# Patient Record
Sex: Female | Born: 1965 | Race: Black or African American | Hispanic: No | Marital: Married | Smoking: Never smoker
Health system: Southern US, Community
[De-identification: ages and names within clinical notes are randomized; demographics above are authoritative.]

## PROBLEM LIST (undated history)

## (undated) DIAGNOSIS — I10 Essential (primary) hypertension: Secondary | ICD-10-CM

## (undated) DIAGNOSIS — K219 Gastro-esophageal reflux disease without esophagitis: Secondary | ICD-10-CM

## (undated) HISTORY — PX: OTHER SURGICAL HISTORY: SHX169

## (undated) HISTORY — PX: APPENDECTOMY: SHX54

---

## 1989-08-02 DIAGNOSIS — O321XX0 Maternal care for breech presentation, not applicable or unspecified: Secondary | ICD-10-CM

## 2010-06-24 ENCOUNTER — Emergency Department: Payer: Self-pay | Admitting: Emergency Medicine

## 2012-07-04 ENCOUNTER — Emergency Department: Payer: Self-pay | Admitting: Emergency Medicine

## 2012-07-04 LAB — URINALYSIS, COMPLETE
Bilirubin,UR: NEGATIVE
Ketone: NEGATIVE
Leukocyte Esterase: NEGATIVE
Protein: NEGATIVE
Specific Gravity: 1.016 (ref 1.003–1.030)
Squamous Epithelial: 6
WBC UR: 1 /HPF (ref 0–5)

## 2012-07-04 LAB — RAPID INFLUENZA A&B ANTIGENS

## 2012-07-04 LAB — COMPREHENSIVE METABOLIC PANEL
Alkaline Phosphatase: 84 U/L (ref 50–136)
Anion Gap: 5 — ABNORMAL LOW (ref 7–16)
Bilirubin,Total: 0.6 mg/dL (ref 0.2–1.0)
Chloride: 110 mmol/L — ABNORMAL HIGH (ref 98–107)
Co2: 25 mmol/L (ref 21–32)
Creatinine: 0.88 mg/dL (ref 0.60–1.30)
EGFR (African American): >60
EGFR (Non-African Amer.): >60
Osmolality: 278 (ref 275–301)
Potassium: 3.8 mmol/L (ref 3.5–5.1)
SGOT(AST): 37 U/L (ref 15–37)
Sodium: 140 mmol/L (ref 136–145)

## 2012-07-04 LAB — CBC
HCT: 38.2 % (ref 35.0–47.0)
Platelet: 330 10*3/uL (ref 150–440)
RBC: 4.21 10*6/uL (ref 3.80–5.20)
RDW: 12.4 % (ref 11.5–14.5)
WBC: 12.2 10*3/uL — ABNORMAL HIGH (ref 3.6–11.0)

## 2015-05-06 ENCOUNTER — Encounter: Payer: Self-pay | Admitting: Obstetrics and Gynecology

## 2015-08-25 ENCOUNTER — Other Ambulatory Visit: Payer: Self-pay | Admitting: Family Medicine

## 2015-08-25 DIAGNOSIS — Z1231 Encounter for screening mammogram for malignant neoplasm of breast: Secondary | ICD-10-CM

## 2015-08-28 ENCOUNTER — Ambulatory Visit
Admission: RE | Admit: 2015-08-28 | Discharge: 2015-08-28 | Disposition: A | Discharge status: Home / Self Care | Payer: BLUE CROSS/BLUE SHIELD | Source: Ambulatory Visit | Attending: Family Medicine | Admitting: Family Medicine

## 2015-08-28 DIAGNOSIS — Z1231 Encounter for screening mammogram for malignant neoplasm of breast: Secondary | ICD-10-CM | POA: Insufficient documentation

## 2015-11-19 ENCOUNTER — Emergency Department
Admission: EM | Admit: 2015-11-19 | Discharge: 2015-11-19 | Disposition: A | Discharge status: Home / Self Care | Payer: BLUE CROSS/BLUE SHIELD | Attending: Emergency Medicine | Admitting: Emergency Medicine

## 2015-11-19 ENCOUNTER — Encounter: Payer: Self-pay | Admitting: Emergency Medicine

## 2015-11-19 ENCOUNTER — Emergency Department: Payer: BLUE CROSS/BLUE SHIELD

## 2015-11-19 DIAGNOSIS — I10 Essential (primary) hypertension: Secondary | ICD-10-CM | POA: Diagnosis not present

## 2015-11-19 DIAGNOSIS — R079 Chest pain, unspecified: Secondary | ICD-10-CM | POA: Insufficient documentation

## 2015-11-19 DIAGNOSIS — R002 Palpitations: Secondary | ICD-10-CM | POA: Diagnosis not present

## 2015-11-19 HISTORY — DX: Essential (primary) hypertension: I10

## 2015-11-19 LAB — CBC
HEMATOCRIT: 36.9 % (ref 35.0–47.0)
HEMOGLOBIN: 12.7 g/dL (ref 12.0–16.0)
MCH: 30.3 pg (ref 26.0–34.0)
MCHC: 34.4 g/dL (ref 32.0–36.0)
MCV: 88.2 fL (ref 80.0–100.0)
Platelets: 310 10*3/uL (ref 150–440)
RBC: 4.18 MIL/uL (ref 3.80–5.20)
RDW: 13.1 % (ref 11.5–14.5)
WBC: 9.1 10*3/uL (ref 3.6–11.0)

## 2015-11-19 LAB — COMPREHENSIVE METABOLIC PANEL
ALK PHOS: 43 U/L (ref 38–126)
ALT: 16 U/L (ref 14–54)
ANION GAP: 8 (ref 5–15)
AST: 23 U/L (ref 15–41)
Albumin: 4.3 g/dL (ref 3.5–5.0)
BILIRUBIN TOTAL: 1.2 mg/dL (ref 0.3–1.2)
BUN: 9 mg/dL (ref 6–20)
CALCIUM: 9.5 mg/dL (ref 8.9–10.3)
CO2: 23 mmol/L (ref 22–32)
Chloride: 106 mmol/L (ref 101–111)
Creatinine, Ser: 1.05 mg/dL — ABNORMAL HIGH (ref 0.44–1.00)
Glucose, Bld: 101 mg/dL — ABNORMAL HIGH (ref 65–99)
Potassium: 3.5 mmol/L (ref 3.5–5.1)
SODIUM: 137 mmol/L (ref 135–145)
TOTAL PROTEIN: 7.9 g/dL (ref 6.5–8.1)

## 2015-11-19 LAB — FIBRIN DERIVATIVES D-DIMER (ARMC ONLY): FIBRIN DERIVATIVES D-DIMER (ARMC): 370 (ref 0–499)

## 2015-11-19 LAB — TROPONIN I: Troponin I: <0.03 ng/mL (ref <0.031)

## 2015-11-19 LAB — LIPASE, BLOOD: LIPASE: 14 U/L (ref 11–51)

## 2015-11-19 LAB — POCT PREGNANCY, URINE: Preg Test, Ur: NEGATIVE

## 2015-11-19 MED ORDER — ASPIRIN 81 MG PO CHEW
324.0000 mg | CHEWABLE_TABLET | Freq: Once | ORAL | Status: AC
  Administered 2015-11-19: 324 mg via ORAL
  Filled 2015-11-19: qty 4

## 2015-11-19 MED ORDER — SODIUM CHLORIDE 0.9 % IV BOLUS (SEPSIS)
1000.0000 mL | Freq: Once | INTRAVENOUS | Status: AC
  Administered 2015-11-19: 1000 mL via INTRAVENOUS

## 2015-11-19 NOTE — ED Provider Notes (Signed)
[facility] Emergency Department Provider Note  ____________________________________________  Time seen: Approximately 12:46 PM  I have reviewed the triage vital signs and the nursing notes.   HISTORY  Chief Complaint Palpitations    HPI Lauren Collier is a 50 y.o. female with history of hypertension who presents for evaluation of 2 days constant chest heaviness, gradual onset, constant since onset, no modifying factors, currently mild to moderate and associated with intermittent palpitations. She is also had mild shortness of breath. She reports that she began taking phentermine for weight loss 3 days ago, this was prescribed to her at a wellness clinic. No nausea, vomiting, diarrhea, fevers or chills per she has had cough. She has no history of coronary artery disease. She does have family history of coronary artery disease and one of her sisters died at the age of 27 from an MI. She denies any history of DVT or PE.   Past Medical History  Diagnosis Date  . Hypertension     There are no active problems to display for this patient.   Past Surgical History  Procedure Laterality Date  . Cesarean section    . Appendectomy      No current outpatient prescriptions on file.  Allergies Review of patient's allergies indicates no known allergies.  Family History  Problem Relation Age of Onset  . Breast cancer Mother 36    Social History Social History  Substance Use Topics  . Smoking status: Never Smoker   . Smokeless tobacco: None  . Alcohol Use: Yes    Review of Systems Constitutional: No fever/chills Eyes: No visual changes. ENT: No sore throat. Cardiovascular: +chest pain. Respiratory: + shortness of breath. Gastrointestinal: No abdominal pain.  No nausea, no vomiting.  No diarrhea.  No constipation. Genitourinary: Negative for dysuria. Musculoskeletal: Negative for back pain. Skin: Negative for rash. Neurological: Negative for headaches,  focal weakness or numbness.  10-point ROS otherwise negative.  ____________________________________________   PHYSICAL EXAM:  VITAL SIGNS: ED Triage Vitals  Enc Vitals Group     BP 11/19/15 1239 152/94 mmHg     Pulse Rate 11/19/15 1239 103     Resp 11/19/15 1239 20     Temp 11/19/15 1239 97.7 F (36.5 C)     Temp Source 11/19/15 1239 Oral     SpO2 11/19/15 1239 100 %     Weight 11/19/15 1239 242 lb (109.77 kg)     Height 11/19/15 1239 5\' 6"  (1.676 m)     Head Cir --      Peak Flow --      Pain Score 11/19/15 1242 8     Pain Loc --      Pain Edu? --      Excl. in [place]? --     Constitutional: Alert and oriented. Well appearing and in no acute distress. Eyes: Conjunctivae are normal. PERRL. EOMI. Head: Atraumatic. Nose: No congestion/rhinnorhea. Mouth/Throat: Mucous membranes are moist.  Oropharynx non-erythematous. Neck: No stridor.  Supple without meningismus. Cardiovascular: Mildly tachycardic rate, regular rhythm. Grossly normal heart sounds.  Good peripheral circulation. Respiratory: Normal respiratory effort.  No retractions. Lungs CTAB. Gastrointestinal: Soft and nontender. No distention.  No CVA tenderness. Genitourinary: deferred Musculoskeletal: No lower extremity tenderness nor edema.  No joint effusions. Neurologic:  Normal speech and language. No gross focal neurologic deficits are appreciated. No gait instability. Skin:  Skin is warm, dry and intact. No rash noted. Psychiatric: Mood and affect are normal. Speech and behavior are normal.  ____________________________________________   LABS (all labs ordered are listed, but only abnormal results are displayed)  Labs Reviewed  COMPREHENSIVE METABOLIC PANEL - Abnormal; Notable for the following:    Glucose, Bld 101 (*)    Creatinine, Ser 1.05 (*)    All other components within normal limits  CBC  TROPONIN I  LIPASE, BLOOD  FIBRIN DERIVATIVES D-DIMER ([facility] ONLY)  TROPONIN I  POC URINE PREG, ED  POCT  PREGNANCY, URINE   ____________________________________________  EKG  ED ECG REPORT I, Joanne Gavel, the attending physician, personally viewed and interpreted this ECG.   Date: 11/19/2015  EKG Time: 12:41  Rate: 102  Rhythm: sinus tachycardia  Axis: normal  Intervals:none  ST&T Change: No acute ST elevation. No acute ST depression.  ____________________________________________  RADIOLOGY  CXR  IMPRESSION: No active cardiopulmonary disease. ____________________________________________   PROCEDURES  Procedure(s) performed: None  Critical Care performed: No  ____________________________________________   INITIAL IMPRESSION / ASSESSMENT AND PLAN / ED COURSE  Pertinent labs & imaging results that were available during my care of the patient were reviewed by me and considered in my medical decision making (see chart for details).  Lauren Collier is a 50 y.o. female with history of hypertension who presents for evaluation of 2 days constant chest heaviness, gradual onset, constant since onset, no modifying factors, currently mild to moderate and associated with intermittent palpitations in the setting of recently starting phentermine for weight loss. On exam she is nontoxic appearing and in no acute distress. He is mildly tachycardic however I suspect this may be secondary to anxiety about being here. The remainder of her vital signs are stable, she is afebrile. EKG not consistent with acute ischemia. Rate normal sinus rhythm, no arrhythmia noted, will observed on the cardiac monitor. She does have strong family history of early coronary artery disease so we will check to his troponins. She is mildly tachycardic so we will screen with d-dimer and pursue CTA chest to rule out PE if the d-dimer is elevated. We'll give IV fluids and aspirin. Suspect her symptoms may be secondary to phentermine use, I have encouraged her to discontinue this  medication.  ----------------------------------------- 3:33 PM on 11/19/2015 ----------------------------------------- Patient reports she feels well. Tachycardia resolved. Labs reviewed. CBC unremarkable. CMP with very mild cranial elevation at 1.05, IV fluids given. Initial troponin negative however given family history of coronary artery disease, second troponin is pending at this time. Normal lipase. D-dimer within normal limits, I doubt that this represents PE or acute aortic dissection. Dr. Darl Householder to follow-up repeat troponin. If unremarkable and the patient continues to appear well, discussed with her that she will follow-up with cardiology as an outpatient as well as primary care. ____________________________________________   FINAL CLINICAL IMPRESSION(S) / ED DIAGNOSES  Final diagnoses:  Heart palpitations  Chest pain, unspecified chest pain type      Joanne Gavel, MD 11/19/15 1549

## 2015-11-19 NOTE — ED Provider Notes (Signed)
  Physical Exam  BP 143/88 mmHg  Pulse 91  Temp(Src) 97.7 F (36.5 C) (Oral)  Resp 18  Ht 5\' 6"  (1.676 m)  Wt 242 lb (109.77 kg)  BMI 39.08 kg/m2  SpO2 100%  LMP 11/08/2015  Physical Exam  ED Course  Procedures  MDM Care assumed at 4 pm from Dr. Edd Fabian. Patient here with palpitations, chest heaviness. D-dimer nl, labs unremarkable. Sign out pending delta trop. Delta trop neg, low risk per Dr. Edd Fabian and stable for dc. DC paperwork done by Dr. Edd Fabian.   Wandra Arthurs, MD 11/19/15 [phone]

## 2015-11-19 NOTE — ED Notes (Signed)
Intermittent palpiiations and chest pains x 2 days. Began phenteramine on Monday for weight loss.

## 2016-02-12 ENCOUNTER — Ambulatory Visit (INDEPENDENT_AMBULATORY_CARE_PROVIDER_SITE_OTHER): Payer: BLUE CROSS/BLUE SHIELD | Admitting: Obstetrics and Gynecology

## 2016-02-12 ENCOUNTER — Encounter: Payer: Self-pay | Admitting: Obstetrics and Gynecology

## 2016-02-12 VITALS — BP 131/85 | HR 80 | Ht 66.0 in | Wt 243.7 lb

## 2016-02-12 DIAGNOSIS — Z98891 History of uterine scar from previous surgery: Secondary | ICD-10-CM

## 2016-02-12 DIAGNOSIS — I10 Essential (primary) hypertension: Secondary | ICD-10-CM | POA: Diagnosis not present

## 2016-02-12 DIAGNOSIS — E669 Obesity, unspecified: Secondary | ICD-10-CM | POA: Insufficient documentation

## 2016-02-12 DIAGNOSIS — Z0000 Encounter for general adult medical examination without abnormal findings: Secondary | ICD-10-CM | POA: Diagnosis not present

## 2016-02-12 DIAGNOSIS — Z01419 Encounter for gynecological examination (general) (routine) without abnormal findings: Secondary | ICD-10-CM

## 2016-02-12 NOTE — Progress Notes (Signed)
Patient ID: Lauren Collier, female   DOB: 1965/11/01, 50 y.o.   MRN: [id number] ANNUAL PREVENTATIVE CARE GYN  ENCOUNTER NOTE  Subjective:       Lauren Collier is a 50 y.o. [phone] female here for a routine annual gynecologic exam.  Current complaints: 1.  Would like to get pregnant    Husband was killed 2 years ago 2 BE married within the next 6 months Cycles are regular on a monthly basis No vasomotor symptoms Significant other is healthy and has fathered children   Gynecologic History Patient's last menstrual period was 01/26/2016 (exact date). Contraception: none Last Pap: 2013. Results were: normal Last mammogram: 2017. Results were: normal  Obstetric History OB History  Gravida Para Term Preterm AB SAB TAB Ectopic Multiple Living  7 7 7       7     # Outcome Date GA Lbr Len/2nd Weight Sex Delivery Anes PTL Lv  7 Term 2008   6 lb (2.722 kg) F Vag-Spont   Y  6 Term 2006   7 lb (3.175 kg) M Vag-Spont   Y  5 Term 1999   6 lb (2.722 kg) F Vag-Spont   Y  4 Term 1997   9 lb (4.082 kg) F Vag-Spont   Y  3 Term 1991   9 lb (4.082 kg) F CS-LTranv   Y     Complications: Breech birth  2 Term 1986   8 lb 2.6 oz (3.701 kg) M Vag-Spont   Y  1 Term 1983   7 lb (3.175 kg) M Vag-Spont   Y      Past Medical History  Diagnosis Date  . Hypertension     Past Surgical History  Procedure Laterality Date  . Cesarean section    . Appendectomy    . Tummy tuck      No current outpatient prescriptions on file prior to visit.   No current facility-administered medications on file prior to visit.    No Known Allergies  Social History   Social History  . Marital Status: Single    Spouse Name: N/A  . Number of Children: N/A  . Years of Education: N/A   Occupational History  . Not on file.   Social History Main Topics  . Smoking status: Never Smoker   . Smokeless tobacco: Not on file  . Alcohol Use: Yes     Comment: social  . Drug Use: No  . Sexual Activity: Yes    Birth  Control/ Protection: None   Other Topics Concern  . Not on file   Social History Narrative    Family History  Problem Relation Age of Onset  . Breast cancer Mother 52  . Diabetes Mother   . Heart disease Sister   . Colon cancer Maternal Grandmother     The following portions of the patient's history were reviewed and updated as appropriate: allergies, current medications, past family history, past medical history, past social history, past surgical history and problem list.  Review of Systems ROS Review of Systems - General ROS: negative for - chills, fatigue, fever, hot flashes, night sweats, weight gain or weight loss Psychological ROS: negative for - anxiety, decreased libido, depression, mood swings, physical abuse or sexual abuse Ophthalmic ROS: negative for - blurry vision, eye pain or loss of vision ENT ROS: negative for - headaches, hearing change, visual changes or vocal changes Allergy and Immunology ROS: negative for - hives, itchy/watery eyes or seasonal allergies Hematological and Lymphatic ROS:  negative for - bleeding problems, bruising, swollen lymph nodes or weight loss Endocrine ROS: negative for - galactorrhea, hair pattern changes, hot flashes, malaise/lethargy, mood swings, palpitations, polydipsia/polyuria, skin changes, temperature intolerance or unexpected weight changes Breast ROS: negative for - new or changing breast lumps or nipple discharge Respiratory ROS: negative for - cough or shortness of breath Cardiovascular ROS: negative for - chest pain, irregular heartbeat, palpitations or shortness of breath Gastrointestinal ROS: no abdominal pain, change in bowel habits, or black or bloody stools Genito-Urinary ROS: no dysuria, trouble voiding, or hematuria Musculoskeletal ROS: negative for - joint pain or joint stiffness Neurological ROS: negative for - bowel and bladder control changes Dermatological ROS: negative for rash and skin lesion changes    Objective:   BP 131/85 mmHg  Pulse 80  Ht 5\' 6"  (1.676 m)  Wt 243 lb 11.2 oz (110.542 kg)  BMI 39.35 kg/m2  LMP 01/26/2016 (Exact Date) CONSTITUTIONAL: Well-developed, well-nourished female in no acute distress.  PSYCHIATRIC: Normal mood and affect. Normal behavior. Normal judgment and thought content. [place]: Alert and oriented to person, place, and time. Normal muscle tone coordination. No cranial nerve deficit noted. HENT:  Normocephalic, atraumatic, External right and left ear normal. Oropharynx is clear and moist EYES: Conjunctivae and EOM are normal. Pupils are equal, round, and reactive to light. No scleral icterus.  NECK: Normal range of motion, supple, no masses.  Normal thyroid.  SKIN: Skin is warm and dry. No rash noted. Not diaphoretic. No erythema. No pallor. CARDIOVASCULAR: Normal heart rate noted, regular rhythm, no murmur. RESPIRATORY: Clear to auscultation bilaterally. Effort and breath sounds normal, no problems with respiration noted. BREASTS: Symmetric in size. No masses, skin changes, nipple drainage, or lymphadenopathy. ABDOMEN: Soft, normal bowel sounds, no distention noted.  No tenderness, rebound or guarding.  BLADDER: Normal PELVIC:  External Genitalia: Normal  BUS: Normal  Vagina: Normal  Cervix: Normal; parous, no lesions  Uterus: Normal; midplane, normal size and shape, mobile  Adnexa: Normal  RV: External Exam NormaI, No Rectal Masses and Normal Sphincter tone  MUSCULOSKELETAL: Normal range of motion. No tenderness.  No cyanosis, clubbing, or edema.  2+ distal pulses. LYMPHATIC: No Axillary, Supraclavicular, or Inguinal Adenopathy.    Assessment:   Annual gynecologic examination 50 y.o. Contraception: none bmi- 39 Problem List Items Addressed This Visit    None    Visit Diagnoses    Well woman exam    -  Primary    Relevant Orders    Pap IG and HPV (high risk) DNA detection       Plan:  Pap: Pap Co Test Mammogram: Not  Indicated Stool Guaiac Testing:  Not Indicated Labs: Lipid 1, FBS, TSH, Hemoglobin A1C and Vit D Level"".. Routine preventative health maintenance measures emphasized: Exercise/Diet/Weight control, Tobacco Warnings and Alcohol/Substance use risks Return to [place], CMA Brayton Mars, MD  Note: This dictation was prepared with Dragon dictation along with smaller phrase technology. Any transcriptional errors that result from this process are unintentional.

## 2016-02-12 NOTE — Patient Instructions (Signed)
1 Pap smear is completed 2. Mammogram already done 3. Healthy eating and exercise with 12 pound weight loss in the ER is encouraged 4. Consider calcium with vitamin D supplementation 5. Contraception: None 6. Take prenatal vitamin daily 7. Return in 1 year

## 2016-02-16 LAB — PAP IG AND HPV HIGH-RISK
HPV, HIGH-RISK: NEGATIVE
PAP SMEAR COMMENT: 0

## 2016-07-07 ENCOUNTER — Ambulatory Visit (INDEPENDENT_AMBULATORY_CARE_PROVIDER_SITE_OTHER): Payer: BLUE CROSS/BLUE SHIELD | Admitting: Obstetrics and Gynecology

## 2016-07-07 ENCOUNTER — Encounter: Payer: Self-pay | Admitting: Obstetrics and Gynecology

## 2016-07-07 VITALS — BP 139/83 | HR 80 | Ht 66.0 in | Wt 250.0 lb

## 2016-07-07 DIAGNOSIS — N951 Menopausal and female climacteric states: Secondary | ICD-10-CM | POA: Diagnosis not present

## 2016-07-07 DIAGNOSIS — N939 Abnormal uterine and vaginal bleeding, unspecified: Secondary | ICD-10-CM

## 2016-07-07 LAB — POCT URINE PREGNANCY: Preg Test, Ur: NEGATIVE

## 2016-07-07 NOTE — Progress Notes (Signed)
GYN ENCOUNTER NOTE  Subjective:       Lauren Collier is a 50 y.o. [phone] female is here for gynecologic evaluation of the following issues:  1. Abnormal uterine bleeding  Patient was having regular menstrual cycles until September 2017. She then did not have any bleeding until November. She had what was apparently a short cycle, followed by several days of amenorrhea, followed by extremely heavy bleeding with cramps. She is now having some mild spotting.  There is a family history of breast cancer. There is a remote family history of ovarian cancer on maternal cousins side of the family Bowel and bladder function are normal   Gynecologic History Patient's last menstrual period was 07/04/2016 (exact date). Contraception: none   Obstetric History OB History  Gravida Para Term Preterm AB Living  7 7 7     7   SAB TAB Ectopic Multiple Live Births          7    # Outcome Date GA Lbr Len/2nd Weight Sex Delivery Anes PTL Lv  7 Term 2008   6 lb (2.722 kg) F Vag-Spont   LIV  6 Term 2006   7 lb (3.175 kg) M Vag-Spont   LIV  5 Term 1999   6 lb (2.722 kg) F Vag-Spont   LIV  4 Term 1997   9 lb (4.082 kg) F Vag-Spont   LIV  3 Term 1991   9 lb (4.082 kg) F CS-LTranv   LIV     Complications: Breech birth  2 Term 1986   8 lb 2.6 oz (3.701 kg) M Vag-Spont   LIV  1 Term 1983   7 lb (3.175 kg) M Vag-Spont   LIV      Past Medical History:  Diagnosis Date  . Hypertension     Past Surgical History:  Procedure Laterality Date  . APPENDECTOMY    . CESAREAN SECTION    . tummy tuck      Current Outpatient Prescriptions on File Prior to Visit  Medication Sig Dispense Refill  . amLODipine (NORVASC) 10 MG tablet Take by mouth.     No current facility-administered medications on file prior to visit.     No Known Allergies  Social History   Social History  . Marital status: Single    Spouse name: N/A  . Number of children: N/A  . Years of education: N/A   Occupational History  . Not  on file.   Social History Main Topics  . Smoking status: Never Smoker  . Smokeless tobacco: Never Used  . Alcohol use Yes     Comment: social  . Drug use: No  . Sexual activity: Yes    Birth control/ protection: None   Other Topics Concern  . Not on file   Social History Narrative  . No narrative on file    Family History  Problem Relation Age of Onset  . Breast cancer Mother 62  . Diabetes Mother   . Heart disease Sister   . Colon cancer Maternal Grandmother   . Ovarian cancer Cousin     maternal    The following portions of the patient's history were reviewed and updated as appropriate: allergies, current medications, past family history, past medical history, past social history, past surgical history and problem list.  Review of Systems Review of Systems - Per history of present illness  Objective:   BP 139/83   Pulse 80   Ht 5\' 6"  (1.676 m)   Wt  250 lb (113.4 kg)   LMP 07/04/2016 (Exact Date)   BMI 40.35 kg/m  CONSTITUTIONAL: Well-developed, well-nourished female in no acute distress.  HENT:  Normocephalic, atraumatic.  NECK:Not examined SKIN: Skin is warm and dry. No rash noted. Not diaphoretic. No erythema. No pallor. [place]: Alert and oriented to person, place, and time. PSYCHIATRIC: Normal mood and affect. Normal behavior. Normal judgment and thought content. CARDIOVASCULAR:Not Examined RESPIRATORY: Not Examined BREASTS: Not Examined ABDOMEN: Soft, non distended; Non tender.  No Organomegaly. PELVIC:  External Genitalia: Normal  BUS: Normal  Vagina: Normal; minimal blood in vault  Cervix: Normal; no lesions; no cervical motion tenderness  Uterus: Normal size, shape,consistency, mobile, midplane  Adnexa: Normal; nonpalpable and nontender  RV: Normal external exam  Bladder: Nontender MUSCULOSKELETAL: Normal range of motion. No tenderness.  No cyanosis, clubbing, or edema.  PROCEDURE: Endometrial biopsy Endometrial Biopsy Procedure  Note  Pre-operative Diagnosis: Abnormal uterine bleeding  Post-operative Diagnosis: Abnormal uterine bleeding  Procedure Details   Urine pregnancy test was done  and result was negative.  The risks (including infection, bleeding, pain, and uterine perforation) and benefits of the procedure were explained to the patient and Verbal informed consent was obtained.  Antibiotic prophylaxis against endocarditis was not indicated.   The patient was placed in the dorsal lithotomy position.  Bimanual exam showed the uterus to be in the neutral position.  A Graves' speculum inserted in the vagina, and the cervix prepped with povidone iodine.  Endocervical curettage with a Kevorkian curette was not performed.   A sharp tenaculum was not applied to the anterior lip of the cervix for stabilization.  A sterile uterine sound was used to sound the uterus to a depth of 8cm.  A Mylex 34mm curette was used to sample the endometrium.  Sample was sent for pathologic examination.  Condition: Stable  Complications: None  Plan:  The patient was advised to call for any fever or for prolonged or severe pain or bleeding. She was advised to use OTC acetaminophen and OTC ibuprofen as needed for mild to moderate pain. She was advised to avoid vaginal intercourse for 48 hours or until the bleeding has completely stopped.  Attending Physician Documentation: Brayton Mars, MD      Assessment:   1. Abnormal uterine bleeding (AUB) - POCT urine pregnancy  2. Climacteric     Plan:   1. Endometrial biopsy 2. Pelvic ultrasound 3. Return in 2 weeks for follow-up of studies and further management planning 4. Postprocedure instructions were given  A total of 15 minutes were spent face-to-face with the patient during this encounter and over half of that time dealt with counseling and coordination of care.  Brayton Mars, MD  Note: This dictation was prepared with Dragon dictation along with  smaller phrase technology. Any transcriptional errors that result from this process are unintentional.

## 2016-07-07 NOTE — Addendum Note (Signed)
Addended by: Elouise Munroe on: 07/07/2016 04:01 PM   Modules accepted: Orders

## 2016-07-07 NOTE — Addendum Note (Signed)
Addended by: Malachi Paradise on: 07/07/2016 02:49 PM   Modules accepted: Orders

## 2016-07-07 NOTE — Patient Instructions (Signed)
1. Endometrial biopsy is performed today 2. Pelvic ultrasound is ordered 3. Return in 2 weeks for review of studies and further management planning    Endometrial Biopsy, Care After Refer to this sheet in the next few weeks. These instructions provide you with information on caring for yourself after your procedure. Your health care provider may also give you more specific instructions. Your treatment has been planned according to current medical practices, but problems sometimes occur. Call your health care provider if you have any problems or questions after your procedure. WHAT TO EXPECT AFTER THE PROCEDURE After your procedure, it is typical to have the following:  You may have mild cramping and a small amount of vaginal bleeding for a few days after the procedure. This is normal. HOME CARE INSTRUCTIONS  Only take over-the-counter or prescription medicine as directed by your health care provider.  Do not douche, use tampons, or have sexual intercourse until your health care provider approves.  Follow your health care provider's instructions regarding any activity restrictions, such as strenuous exercise or heavy lifting. SEEK MEDICAL CARE IF:  You have heavy bleeding or bleeding longer than 2 days after the procedure.  You have bad smelling drainage from your vagina.  You have a fever and chills.  Youhave severe lower stomach (abdominal) pain. SEEK IMMEDIATE MEDICAL CARE IF:  You have severe cramps in your stomach or back.  You pass large blood clots.  Your bleeding increases.  You become weak or lightheaded, or you pass out. This information is not intended to replace advice given to you by your health care provider. Make sure you discuss any questions you have with your health care provider. Document Released: 05/09/2013 Document Reviewed: 05/09/2013 Elsevier Interactive Patient Education  2017 [employer].

## 2016-07-08 ENCOUNTER — Ambulatory Visit (INDEPENDENT_AMBULATORY_CARE_PROVIDER_SITE_OTHER): Payer: BLUE CROSS/BLUE SHIELD

## 2016-07-08 DIAGNOSIS — N939 Abnormal uterine and vaginal bleeding, unspecified: Secondary | ICD-10-CM

## 2016-07-09 LAB — PATHOLOGY

## 2016-07-21 ENCOUNTER — Encounter: Payer: BLUE CROSS/BLUE SHIELD | Admitting: Obstetrics and Gynecology

## 2016-08-04 ENCOUNTER — Encounter: Payer: BLUE CROSS/BLUE SHIELD | Admitting: Obstetrics and Gynecology

## 2017-02-14 NOTE — Progress Notes (Deleted)
Patient ID: Lauren Collier, female   DOB: February 24, 1966, 51 y.o.   MRN: [id number] ANNUAL PREVENTATIVE CARE GYN  ENCOUNTER NOTE  Subjective:       Lauren Collier is a 51 y.o. [phone] female here for a routine annual gynecologic exam.  Current complaints: 1.    red children   Gynecologic History No LMP recorded. Contraception: none Last Pap: 01/2016 neg/neg Results were: normal Last mammogram: 08/2015 birad 1 Results were: normal  Obstetric History OB History  Gravida Para Term Preterm AB Living  7 7 7     7   SAB TAB Ectopic Multiple Live Births          7    # Outcome Date GA Lbr Len/2nd Weight Sex Delivery Anes PTL Lv  7 Term 2008   6 lb (2.722 kg) F Vag-Spont   LIV  6 Term 2006   7 lb (3.175 kg) M Vag-Spont   LIV  5 Term 1999   6 lb (2.722 kg) F Vag-Spont   LIV  4 Term 1997   9 lb (4.082 kg) F Vag-Spont   LIV  3 Term 1991   9 lb (4.082 kg) F CS-LTranv   LIV     Complications: Breech birth  2 Term 1986   8 lb 2.6 oz (3.701 kg) M Vag-Spont   LIV  1 Term 1983   7 lb (3.175 kg) M Vag-Spont   LIV      Past Medical History:  Diagnosis Date  . Hypertension     Past Surgical History:  Procedure Laterality Date  . APPENDECTOMY    . CESAREAN SECTION    . tummy tuck      Current Outpatient Prescriptions on File Prior to Visit  Medication Sig Dispense Refill  . amLODipine (NORVASC) 10 MG tablet Take by mouth.     No current facility-administered medications on file prior to visit.     No Known Allergies  Social History   Social History  . Marital status: Single    Spouse name: N/A  . Number of children: N/A  . Years of education: N/A   Occupational History  . Not on file.   Social History Main Topics  . Smoking status: Never Smoker  . Smokeless tobacco: Never Used  . Alcohol use Yes     Comment: social  . Drug use: No  . Sexual activity: Yes    Birth control/ protection: None   Other Topics Concern  . Not on file   Social History Narrative  . No narrative  on file    Family History  Problem Relation Age of Onset  . Breast cancer Mother 7  . Diabetes Mother   . Heart disease Sister   . Colon cancer Maternal Grandmother   . Ovarian cancer Cousin        maternal    The following portions of the patient's history were reviewed and updated as appropriate: allergies, current medications, past family history, past medical history, past social history, past surgical history and problem list.  Review of Systems ROS    Objective:   There were no vitals taken for this visit. CONSTITUTIONAL: Well-developed, well-nourished female in no acute distress.  PSYCHIATRIC: Normal mood and affect. Normal behavior. Normal judgment and thought content. [place]: Alert and oriented to person, place, and time. Normal muscle tone coordination. No cranial nerve deficit noted. HENT:  Normocephalic, atraumatic, External right and left ear normal. Oropharynx is clear and moist EYES: Conjunctivae and EOM are  normal. Pupils are equal, round, and reactive to light. No scleral icterus.  NECK: Normal range of motion, supple, no masses.  Normal thyroid.  SKIN: Skin is warm and dry. No rash noted. Not diaphoretic. No erythema. No pallor. CARDIOVASCULAR: Normal heart rate noted, regular rhythm, no murmur. RESPIRATORY: Clear to auscultation bilaterally. Effort and breath sounds normal, no problems with respiration noted. BREASTS: Symmetric in size. No masses, skin changes, nipple drainage, or lymphadenopathy. ABDOMEN: Soft, normal bowel sounds, no distention noted.  No tenderness, rebound or guarding.  BLADDER: Normal PELVIC:  External Genitalia: Normal  BUS: Normal  Vagina: Normal  Cervix: Normal; parous, no lesions  Uterus: Normal; midplane, normal size and shape, mobile  Adnexa: Normal  RV: External Exam NormaI, No Rectal Masses and Normal Sphincter tone  MUSCULOSKELETAL: Normal range of motion. No tenderness.  No cyanosis, clubbing, or edema.  2+ distal  pulses. LYMPHATIC: No Axillary, Supraclavicular, or Inguinal Adenopathy.    Assessment:   Annual gynecologic examination 51 y.o. Contraception: none bmi- 39 Problem List Items Addressed This Visit    Essential hypertension   Obesity (BMI 30-39.9)    Other Visit Diagnoses    Well woman exam    -  Primary   History of C-section          Plan:  Pap: Due 2020 Mammogram:ordered Stool Guaiac Testing:  Not Indicated Labs: Lipid 1, FBS, TSH, Hemoglobin A1C and Vit D Level"".. Routine preventative health maintenance measures emphasized: Exercise/Diet/Weight control, Tobacco Warnings and Alcohol/Substance use risks Return to [place]  Note: This dictation was prepared with Dragon dictation along with smaller phrase technology. Any transcriptional errors that result from this process are unintentional.

## 2017-02-15 ENCOUNTER — Encounter: Payer: BLUE CROSS/BLUE SHIELD | Admitting: Obstetrics and Gynecology

## 2017-03-16 ENCOUNTER — Encounter: Payer: BLUE CROSS/BLUE SHIELD | Admitting: Obstetrics and Gynecology

## 2017-03-22 ENCOUNTER — Encounter: Payer: BLUE CROSS/BLUE SHIELD | Admitting: Obstetrics and Gynecology

## 2017-05-10 ENCOUNTER — Encounter (HOSPITAL_COMMUNITY): Payer: Self-pay | Admitting: Emergency Medicine

## 2017-05-10 ENCOUNTER — Emergency Department (HOSPITAL_COMMUNITY)
Admission: EM | Admit: 2017-05-10 | Discharge: 2017-05-10 | Disposition: A | Discharge status: Home / Self Care | Payer: BLUE CROSS/BLUE SHIELD | Attending: Emergency Medicine | Admitting: Emergency Medicine

## 2017-05-10 ENCOUNTER — Emergency Department (HOSPITAL_COMMUNITY): Payer: BLUE CROSS/BLUE SHIELD

## 2017-05-10 DIAGNOSIS — R062 Wheezing: Secondary | ICD-10-CM | POA: Insufficient documentation

## 2017-05-10 DIAGNOSIS — R0602 Shortness of breath: Secondary | ICD-10-CM | POA: Diagnosis not present

## 2017-05-10 DIAGNOSIS — J40 Bronchitis, not specified as acute or chronic: Secondary | ICD-10-CM

## 2017-05-10 DIAGNOSIS — R05 Cough: Secondary | ICD-10-CM | POA: Diagnosis not present

## 2017-05-10 DIAGNOSIS — Z79899 Other long term (current) drug therapy: Secondary | ICD-10-CM | POA: Diagnosis not present

## 2017-05-10 DIAGNOSIS — I10 Essential (primary) hypertension: Secondary | ICD-10-CM | POA: Insufficient documentation

## 2017-05-10 DIAGNOSIS — R079 Chest pain, unspecified: Secondary | ICD-10-CM | POA: Diagnosis present

## 2017-05-10 LAB — CBC
HCT: 38 % (ref 36.0–46.0)
HEMOGLOBIN: 12.3 g/dL (ref 12.0–15.0)
MCH: 29.1 pg (ref 26.0–34.0)
MCHC: 32.4 g/dL (ref 30.0–36.0)
MCV: 89.8 fL (ref 78.0–100.0)
PLATELETS: 349 10*3/uL (ref 150–400)
RBC: 4.23 MIL/uL (ref 3.87–5.11)
RDW: 13.1 % (ref 11.5–15.5)
WBC: 12.6 10*3/uL — ABNORMAL HIGH (ref 4.0–10.5)

## 2017-05-10 LAB — BASIC METABOLIC PANEL
ANION GAP: 10 (ref 5–15)
BUN: 17 mg/dL (ref 6–20)
CALCIUM: 9.2 mg/dL (ref 8.9–10.3)
CO2: 25 mmol/L (ref 22–32)
CREATININE: 1.09 mg/dL — AB (ref 0.44–1.00)
Chloride: 102 mmol/L (ref 101–111)
GFR calc Af Amer: >60 mL/min (ref >60)
GFR, EST NON AFRICAN AMERICAN: 58 mL/min — AB (ref >60)
GLUCOSE: 94 mg/dL (ref 65–99)
Potassium: 3.5 mmol/L (ref 3.5–5.1)
Sodium: 137 mmol/L (ref 135–145)

## 2017-05-10 LAB — LIPASE, BLOOD: Lipase: 18 U/L (ref 11–51)

## 2017-05-10 LAB — I-STAT TROPONIN, ED: TROPONIN I, POC: 0 ng/mL (ref 0.00–0.08)

## 2017-05-10 MED ORDER — PREDNISONE 20 MG PO TABS: 40.0000 mg | ORAL_TABLET | Freq: Every day | ORAL | 0 refills | Status: DC

## 2017-05-10 MED ORDER — ALBUTEROL SULFATE HFA 108 (90 BASE) MCG/ACT IN AERS
2.0000 | INHALATION_SPRAY | RESPIRATORY_TRACT | Status: DC | PRN
  Administered 2017-05-10: 2 via RESPIRATORY_TRACT
  Filled 2017-05-10: qty 6.7

## 2017-05-10 NOTE — ED Provider Notes (Signed)
[facility] DEPT Provider Note   CSN: [id number] Arrival date & time: 05/10/17  4097     History   Chief Complaint Chief Complaint  Patient presents with  . Chest Pain  . Cough    HPI Lauren Collier is a 51 y.o. female.  Patient is a 51 year old female with a history of hypertension who is a nonsmoker presenting with URI symptoms of cough, congestion, shortness of breath with exertion and wheezing. Patient's granddaughter is been sick in multiple family members with similar symptoms. She describes the chest tightness like there is congestion she cannot get out of her chest area.  She denies any abdominal pain, nausea or vomiting.   The history is provided by the patient.  URI   This is a new problem. Episode onset: 5 days. The problem has not changed since onset.There has been no fever. Associated symptoms include chest pain, congestion, plugged ear sensation, rhinorrhea, cough and wheezing. Pertinent negatives include no nausea, no vomiting, no dysuria and no neck pain. Associated symptoms comments: Sob when walking. Treatments tried: robitussin. The treatment provided no relief.    Past Medical History:  Diagnosis Date  . Hypertension     Patient Active Problem List   Diagnosis Date Noted  . Climacteric 07/07/2016  . Abnormal uterine bleeding (AUB) 07/07/2016  . Essential hypertension 02/12/2016  . Obesity (BMI 30-39.9) 02/12/2016    Past Surgical History:  Procedure Laterality Date  . APPENDECTOMY    . CESAREAN SECTION    . tummy tuck      OB History    Gravida Para Term Preterm AB Living   7 7 7     7    SAB TAB Ectopic Multiple Live Births           7       Home Medications    Prior to Admission medications   Medication Sig Start Date End Date Taking? Authorizing Provider  losartan (COZAAR) 100 MG tablet Take 100 mg by mouth daily.   Yes [provider]  OVER THE COUNTER MEDICATION Take 1 tablet by mouth daily. Female vitamin complex   Yes  [provider]  predniSONE (DELTASONE) 20 MG tablet Take 2 tablets (40 mg total) by mouth daily. 05/10/17   Blanchie Dessert, MD    Family History Family History  Problem Relation Age of Onset  . Breast cancer Mother 60  . Diabetes Mother   . Heart disease Sister   . Colon cancer Maternal Grandmother   . Ovarian cancer Cousin        maternal    Social History Social History  Substance Use Topics  . Smoking status: Never Smoker  . Smokeless tobacco: Never Used  . Alcohol use Yes     Comment: social     Allergies   Patient has no known allergies.   Review of Systems Review of Systems  HENT: Positive for congestion and rhinorrhea.   Respiratory: Positive for cough and wheezing.   Cardiovascular: Positive for chest pain.  Gastrointestinal: Negative for nausea and vomiting.  Genitourinary: Negative for dysuria.  Musculoskeletal: Negative for neck pain.  All other systems reviewed and are negative.    Physical Exam Updated Vital Signs BP 123/77   Pulse 76   Temp 98 F (36.7 C) (Oral)   Resp 16   SpO2 100%   Physical Exam  Constitutional: She is oriented to person, place, and time. She appears well-developed and well-nourished. No distress.  HENT:  Head: Normocephalic and  atraumatic.  Right Ear: Tympanic membrane normal.  Left Ear: Tympanic membrane normal.  Nose: Mucosal edema and rhinorrhea present.  Mouth/Throat: Oropharynx is clear and moist.  Eyes: Pupils are equal, round, and reactive to light. Conjunctivae and EOM are normal.  Neck: Normal range of motion. Neck supple.  Cardiovascular: Normal rate, regular rhythm and intact distal pulses.   No murmur heard. Pulmonary/Chest: Effort normal. No respiratory distress. She has wheezes. She has no rales.  Scant expiratory wheezing on the right side  Abdominal: Soft. She exhibits no distension. There is no tenderness. There is no rebound and no guarding.  Musculoskeletal: Normal range of motion. She  exhibits no edema or tenderness.  Neurological: She is alert and oriented to person, place, and time.  Skin: Skin is warm and dry. No rash noted. No erythema.  Psychiatric: She has a normal mood and affect. Her behavior is normal.  Nursing note and vitals reviewed.    ED Treatments / Results  Labs (all labs ordered are listed, but only abnormal results are displayed) Labs Reviewed  BASIC METABOLIC PANEL - Abnormal; Notable for the following:       Result Value   Creatinine, Ser 1.09 (*)    GFR calc non Af Amer 58 (*)    All other components within normal limits  CBC - Abnormal; Notable for the following:    WBC 12.6 (*)    All other components within normal limits  LIPASE, BLOOD  I-STAT TROPONIN, ED    EKG  EKG Interpretation  Date/Time:  Tuesday May 10 2017 09:53:49 EDT Ventricular Rate:  86 PR Interval:  138 QRS Duration: 86 QT Interval:  360 QTC Calculation: 430 R Axis:     Text Interpretation:  Normal sinus rhythm Minimal voltage criteria for LVH, may be normal variant Nonspecific ST and T wave abnormality No significant change since last tracing Confirmed by Blanchie Dessert [id number]) on 05/10/2017 2:44:04 PM       Radiology Dg Chest 2 View  Result Date: 05/10/2017 CLINICAL DATA:  Mid chest pain and chest congestion associated with shortness of breath and productive cough for the past 4 days. History of hypertension, non smoker. EXAM: CHEST  2 VIEW COMPARISON:  Chest x-ray of November 19, 2015 FINDINGS: The lungs are adequately inflated. There is no focal infiltrate. There is no pleural effusion. The heart and pulmonary vascularity are normal. The mediastinum is normal in width. The bony thorax exhibits no acute cardiopulmonary abnormality. IMPRESSION: Mild chronic interstitial prominence may reflect reactive airway disease or chronic bronchitic changes. No pneumonia nor CHF. Electronically Signed   By: David  Martinique M.D.   On: 05/10/2017 10:25     Procedures Procedures (including critical care time)  Medications Ordered in ED Medications  albuterol (PROVENTIL HFA;VENTOLIN HFA) 108 (90 Base) MCG/ACT inhaler 2 puff (not administered)     Initial Impression / Assessment and Plan / ED Course  I have reviewed the triage vital signs and the nursing notes.  Pertinent labs & imaging results that were available during my care of the patient were reviewed by me and considered in my medical decision making (see chart for details).    Pt with symptoms consistent with viral bronchitis.  Well appearing here.  No signs of breathing difficulty but scant wheezing.  No signs of pharyngitis, otitis or abnormal abdominal findings.   CXR wnl and pt to return with any further problems.  EKG and labs including trop done in the waiting room wnl.  Pt's chest  pain is not concerning for cardiac etiology. Pt to take mucinex, prednisone and albuterol.   Final Clinical Impressions(s) / ED Diagnoses   Final diagnoses:  Bronchitis    New Prescriptions New Prescriptions   PREDNISONE (DELTASONE) 20 MG TABLET    Take 2 tablets (40 mg total) by mouth daily.     Blanchie Dessert, MD 05/10/17 [phone]

## 2017-05-10 NOTE — ED Triage Notes (Addendum)
Bad bad Cough and congestion x 5 dqays  today woke up and had some cp , constant  And sob. taki ng OTC meds not helping, has had some nausea and feels it maybe her GERD

## 2017-06-07 ENCOUNTER — Ambulatory Visit: Payer: BLUE CROSS/BLUE SHIELD | Admitting: Gastroenterology

## 2017-06-15 ENCOUNTER — Ambulatory Visit: Payer: BLUE CROSS/BLUE SHIELD | Admitting: Gastroenterology

## 2017-06-15 ENCOUNTER — Encounter (INDEPENDENT_AMBULATORY_CARE_PROVIDER_SITE_OTHER): Payer: Self-pay

## 2017-06-15 ENCOUNTER — Encounter: Payer: Self-pay | Admitting: Gastroenterology

## 2017-06-15 VITALS — BP 138/89 | HR 71 | Ht 66.0 in | Wt 256.0 lb

## 2017-06-15 DIAGNOSIS — K219 Gastro-esophageal reflux disease without esophagitis: Secondary | ICD-10-CM | POA: Diagnosis not present

## 2017-06-15 MED ORDER — OMEPRAZOLE 20 MG PO CPDR: 20.0000 mg | DELAYED_RELEASE_CAPSULE | Freq: Every day | ORAL | 3 refills | Status: DC

## 2017-06-15 NOTE — Progress Notes (Signed)
[name], MD [address], [place], [place], [place], [address] [address], [place], [place], [place], [address] Phone: [phone]  Fax: [phone]  Consultation  Referring Provider:     [facility], [facility] Primary Care Physician:  [facility], [place] Primary Gastroenterologist:  Virgel Manifold, MD        Reason for Referral:     Acid reflux  Date of Consultation:  06/15/2017         HPI:   Lauren Collier is a 51 y.o. female with 2 yr hx of heartburn daily with bad taste in mouth at times. Denies abdominal pain. No dysphagia, no weight loss. No N/V. Used to be on Nexium but used it intermittently and tums intermittently that helped somewhat. Has not taken Nexium in a year. No altered bowel habits. Grandmother had colon cancer, but no immediate family members with cancer. No previous endoscopies.   Past Medical History:  Diagnosis Date  . Hypertension     Past Surgical History:  Procedure Laterality Date  . APPENDECTOMY    . CESAREAN SECTION    . tummy tuck      Prior to Admission medications   Medication Sig Start Date End Date Taking? Authorizing Provider  losartan (COZAAR) 100 MG tablet Take 100 mg by mouth daily.    [provider]  OVER THE COUNTER MEDICATION Take 1 tablet by mouth daily. Female vitamin complex    [provider]  predniSONE (DELTASONE) 20 MG tablet Take 2 tablets (40 mg total) by mouth daily. 05/10/17   Blanchie Dessert, MD    Family History  Problem Relation Age of Onset  . Breast cancer Mother 64  . Diabetes Mother   . Heart disease Sister   . Colon cancer Maternal Grandmother   . Ovarian cancer Cousin        maternal     Social History   Tobacco Use  . Smoking status: Never Smoker  . Smokeless tobacco: Never Used  Substance Use Topics  . Alcohol use: Yes    Comment: social  . Drug use: No    Allergies as of 06/15/2017  . (No Known Allergies)    Review of Systems:    All  systems reviewed and negative except where noted in HPI.   Physical Exam:  Vital signs in last 24 hours: Reviewed   General:   Pleasant, cooperative in NAD Head:  Normocephalic and atraumatic. Eyes:   No icterus.   Conjunctiva pink. PERRLA. Ears:  Normal auditory acuity. Neck:  Supple; no masses or thyroidomegaly Lungs: Respirations even and unlabored. Lungs clear to auscultation bilaterally.   No wheezes, crackles, or rhonchi.  Heart:  Regular rate and rhythm;  Without murmur, clicks, rubs or gallops Abdomen:  Soft, nondistended, nontender. Normal bowel sounds. No appreciable masses or hepatomegaly.  No rebound or guarding.  Neurologic:  Alert and oriented x3;  grossly normal neurologically. Skin:  Intact without significant lesions or rashes. Cervical Nodes:  No significant cervical adenopathy. Psych:  Alert and cooperative. Normal affect.  LAB RESULTS: No results for input(s): WBC, HGB, HCT, PLT in the last 72 hours. BMET No results for input(s): NA, K, CL, CO2, GLUCOSE, BUN, CREATININE, CALCIUM in the last 72 hours. LFT No results for input(s): PROT, ALBUMIN, AST, ALT, ALKPHOS, BILITOT, BILIDIR, IBILI in the last 72 hours. PT/INR No results for input(s): LABPROT, INR in the last 72 hours.   Oct 9th labs reviewed and normal Hgb and normal liver enzymes  STUDIES: No results found.    Impression / Plan:   Lauren Collier is a 51 y.o. y/o female with chronic reflux not on PPI presents for initial visit  Pt. Educated on lifestyle modifications, weight loss and given handouts for the same  Will start Omeprazole 20mg  daily, 30 mins before breakfast everyday. Risks of medication including bone loss, CKD, C. Diff, pneumonia, hypomagnesemia discussed. Pt. Verbalized understanding and goal would be to minimize dose long term and rely on lifestyle modifications  Pt. Needs average risk screening as she is 51 but would prefer to schedule EGD with it if its needed. Will monitor for acid  reflux symptom improvement and if it does not improve and EGD is indicated will schedule it with her screening colonoscopy.   RTC in 2 months at which time symptoms can be discussed and colonoscopy with or without EGD can be ordered.   No alarm symptoms present at this time.    Thank you for involving me in the care of this patient.    Virgel Manifold, MD  06/15/2017, 10:44 AM

## 2017-07-15 ENCOUNTER — Emergency Department (HOSPITAL_COMMUNITY)
Admission: EM | Admit: 2017-07-15 | Discharge: 2017-07-16 | Disposition: A | Discharge status: Home / Self Care | Payer: BLUE CROSS/BLUE SHIELD | Attending: Emergency Medicine | Admitting: Emergency Medicine

## 2017-07-15 ENCOUNTER — Emergency Department (HOSPITAL_COMMUNITY): Payer: BLUE CROSS/BLUE SHIELD

## 2017-07-15 ENCOUNTER — Encounter (HOSPITAL_COMMUNITY): Payer: Self-pay | Admitting: Emergency Medicine

## 2017-07-15 ENCOUNTER — Other Ambulatory Visit: Payer: Self-pay

## 2017-07-15 DIAGNOSIS — Y998 Other external cause status: Secondary | ICD-10-CM | POA: Insufficient documentation

## 2017-07-15 DIAGNOSIS — M79642 Pain in left hand: Secondary | ICD-10-CM

## 2017-07-15 DIAGNOSIS — S0990XA Unspecified injury of head, initial encounter: Secondary | ICD-10-CM | POA: Diagnosis not present

## 2017-07-15 DIAGNOSIS — S39012A Strain of muscle, fascia and tendon of lower back, initial encounter: Secondary | ICD-10-CM | POA: Insufficient documentation

## 2017-07-15 DIAGNOSIS — Z79899 Other long term (current) drug therapy: Secondary | ICD-10-CM | POA: Insufficient documentation

## 2017-07-15 DIAGNOSIS — Y92410 Unspecified street and highway as the place of occurrence of the external cause: Secondary | ICD-10-CM | POA: Diagnosis not present

## 2017-07-15 DIAGNOSIS — Y9389 Activity, other specified: Secondary | ICD-10-CM | POA: Diagnosis not present

## 2017-07-15 DIAGNOSIS — I10 Essential (primary) hypertension: Secondary | ICD-10-CM | POA: Diagnosis not present

## 2017-07-15 DIAGNOSIS — S161XXA Strain of muscle, fascia and tendon at neck level, initial encounter: Secondary | ICD-10-CM | POA: Diagnosis not present

## 2017-07-15 DIAGNOSIS — S199XXA Unspecified injury of neck, initial encounter: Secondary | ICD-10-CM | POA: Diagnosis present

## 2017-07-15 MED ORDER — HYDROCODONE-ACETAMINOPHEN 5-325 MG PO TABS
1.0000 | ORAL_TABLET | Freq: Once | ORAL | Status: AC
  Administered 2017-07-16: 1 via ORAL
  Filled 2017-07-15: qty 1

## 2017-07-15 MED ORDER — IBUPROFEN 400 MG PO TABS
600.0000 mg | ORAL_TABLET | Freq: Once | ORAL | Status: AC
  Administered 2017-07-16: 600 mg via ORAL
  Filled 2017-07-15: qty 1
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
## 2017-07-15 NOTE — ED Notes (Signed)
No answer for treatment room x1

## 2017-07-16 ENCOUNTER — Emergency Department (HOSPITAL_COMMUNITY): Payer: BLUE CROSS/BLUE SHIELD

## 2017-07-16 DIAGNOSIS — S161XXA Strain of muscle, fascia and tendon at neck level, initial encounter: Secondary | ICD-10-CM | POA: Diagnosis not present

## 2017-07-16 MED ORDER — NAPROXEN 375 MG PO TABS: 375.0000 mg | ORAL_TABLET | Freq: Two times a day (BID) | ORAL | 0 refills | Status: DC

## 2017-07-16 MED ORDER — CYCLOBENZAPRINE HCL 10 MG PO TABS: 10.0000 mg | ORAL_TABLET | Freq: Three times a day (TID) | ORAL | 0 refills | Status: DC | PRN

## 2017-07-16 NOTE — ED Notes (Signed)
Pt departed in NAD, escorted in a wheelchair by this RN.

## 2017-07-16 NOTE — ED Notes (Signed)
Patient transported to CT 

## 2017-07-16 NOTE — ED Notes (Signed)
Patient transported to X-ray 

## 2017-07-16 NOTE — Discharge Instructions (Signed)
Your images showed no signs of fracture.  This is likely a musculoskeletal pain.  Encourage symptomatic treatment with warm compresses.  Warm soaks in Epsom salt. Please take the Naproxen as prescribed for pain. Do not take any additional NSAIDs including Motrin, Aleve, Ibuprofen, Advil.  Have been given a dose in the ED. Please the the Flexeril for muscle relaxation. This medication will make you drowsy so avoid situation that could place you in danger.  Follow-up with primary care doctor and return to the ED with any worsening symptoms.
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
## 2017-08-22 ENCOUNTER — Encounter: Payer: Self-pay | Admitting: Gastroenterology

## 2017-08-22 ENCOUNTER — Ambulatory Visit: Payer: BLUE CROSS/BLUE SHIELD | Admitting: Gastroenterology

## 2017-09-21 ENCOUNTER — Other Ambulatory Visit: Payer: Self-pay

## 2017-09-21 ENCOUNTER — Encounter: Payer: Self-pay | Admitting: Gastroenterology

## 2017-09-21 ENCOUNTER — Ambulatory Visit: Payer: BLUE CROSS/BLUE SHIELD | Admitting: Gastroenterology

## 2017-09-21 ENCOUNTER — Encounter (INDEPENDENT_AMBULATORY_CARE_PROVIDER_SITE_OTHER): Payer: Self-pay

## 2017-09-21 VITALS — BP 123/81 | HR 84 | Ht 65.0 in | Wt 252.2 lb

## 2017-09-21 DIAGNOSIS — K219 Gastro-esophageal reflux disease without esophagitis: Secondary | ICD-10-CM

## 2017-09-21 DIAGNOSIS — R1310 Dysphagia, unspecified: Secondary | ICD-10-CM | POA: Diagnosis not present

## 2017-09-21 DIAGNOSIS — Z1211 Encounter for screening for malignant neoplasm of colon: Secondary | ICD-10-CM

## 2017-09-21 NOTE — Patient Instructions (Signed)
Gastroesophageal Reflux Disease, Adult Normally, food travels down the esophagus and stays in the stomach to be digested. If a person has gastroesophageal reflux disease (GERD), food and stomach acid move back up into the esophagus. When this happens, the esophagus becomes sore and swollen (inflamed). Over time, GERD can make small holes (ulcers) in the lining of the esophagus. Follow these instructions at home: Diet  Follow a diet as told by your doctor. You may need to avoid foods and drinks such as: ? Coffee and tea (with or without caffeine). ? Drinks that contain alcohol. ? Energy drinks and sports drinks. ? Carbonated drinks or sodas. ? Chocolate and cocoa. ? Peppermint and mint flavorings. ? Garlic and onions. ? Horseradish. ? Spicy and acidic foods, such as peppers, chili powder, curry powder, vinegar, hot sauces, and BBQ sauce. ? Citrus fruit juices and citrus fruits, such as oranges, lemons, and limes. ? Tomato-based foods, such as red sauce, chili, salsa, and pizza with red sauce. ? Fried and fatty foods, such as donuts, french fries, potato chips, and high-fat dressings. ? High-fat meats, such as hot dogs, rib eye steak, sausage, ham, and bacon. ? High-fat dairy items, such as whole milk, butter, and cream cheese.  Eat small meals often. Avoid eating large meals.  Avoid drinking large amounts of liquid with your meals.  Avoid eating meals during the 2-3 hours before bedtime.  Avoid lying down right after you eat.  Do not exercise right after you eat. General instructions  Pay attention to any changes in your symptoms.  Take over-the-counter and prescription medicines only as told by your doctor. Do not take aspirin, ibuprofen, or other NSAIDs unless your doctor says it is okay.  Do not use any tobacco products, including cigarettes, chewing tobacco, and e-cigarettes. If you need help quitting, ask your doctor.  Wear loose clothes. Do not wear anything tight around  your waist.  Raise (elevate) the head of your bed about 6 inches (15 cm).  Try to lower your stress. If you need help doing this, ask your doctor.  If you are overweight, lose an amount of weight that is healthy for you. Ask your doctor about a safe weight loss goal.  Keep all follow-up visits as told by your doctor. This is important. Contact a doctor if:  You have new symptoms.  You lose weight and you do not know why it is happening.  You have trouble swallowing, or it hurts to swallow.  You have wheezing or a cough that keeps happening.  Your symptoms do not get better with treatment.  You have a hoarse voice. Get help right away if:  You have pain in your arms, neck, jaw, teeth, or back.  You feel sweaty, dizzy, or light-headed.  You have chest pain or shortness of breath.  You throw up (vomit) and your throw up looks like blood or coffee grounds.  You pass out (faint).  Your poop (stool) is bloody or black.  You cannot swallow, drink, or eat. This information is not intended to replace advice given to you by your health care provider. Make sure you discuss any questions you have with your health care provider. Document Released: 01/05/2008 Document Revised: 12/25/2015 Document Reviewed: 11/13/2014 Elsevier Interactive Patient Education  2018 [place].  F/U 3 MONTHS BED WEDGE

## 2017-09-21 NOTE — Progress Notes (Signed)
Lauren Antigua, MD [address]  [place]  [place], [place] [address]  Main: [phone]  Fax: [phone]   Primary Care Physician: [facility]  Primary Gastroenterologist:  Dr. Vonda Collier  Chief Complaint  Patient presents with  . Follow-up    2 MOS. gerd    HPI: Lauren Collier is a 52 y.o. female here for follow-up of acid reflux.  Reports taking Prilosec for 2 weeks after we prescribed to her.  Symptoms resolved after 2 weeks and she stopped taking the medication and started taking over-the-counter Pepcid daily.  With this her heartburn remains well controlled.  She states prior to initiating the PPI or the H2 RA, she was also having a this has resolved completely as well.  She also reports mild midepigastric pain, cramping, 5/10, intermittent, not associated with meals, that was occurring prior to the medications as well.  This is resolved as well.  No weight loss or abdominal pain at this time.  No immediate family history of colon cancer.  No previous endoscopies.  Current Outpatient Medications  Medication Sig Dispense Refill  . losartan (COZAAR) 100 MG tablet Take 100 mg by mouth daily.    Marland Kitchen OVER THE COUNTER MEDICATION Take 1 tablet by mouth daily. Female vitamin complex    . ranitidine (ZANTAC) 150 MG tablet Take 150 mg by mouth daily.    . cyclobenzaprine (FLEXERIL) 10 MG tablet Take 1 tablet (10 mg total) by mouth 3 (three) times daily as needed for muscle spasms. (Patient not taking: Reported on 09/21/2017) 10 tablet 0  . naproxen (NAPROSYN) 375 MG tablet Take 1 tablet (375 mg total) by mouth 2 (two) times daily. (Patient not taking: Reported on 09/21/2017) 20 tablet 0  . omeprazole (PRILOSEC) 20 MG capsule Take 1 capsule (20 mg total) daily by mouth. 30 capsule 3   No current facility-administered medications for this visit.     Allergies as of 09/21/2017  . (No Known Allergies)    ROS:  General: Negative for anorexia, weight  loss, fever, chills, fatigue, weakness. ENT: Negative for hoarseness, difficulty swallowing , nasal congestion. CV: Negative for chest pain, angina, palpitations, dyspnea on exertion, peripheral edema.  Respiratory: Negative for dyspnea at rest, dyspnea on exertion, cough, sputum, wheezing.  GI: See history of present illness. GU:  Negative for dysuria, hematuria, urinary incontinence, urinary frequency, nocturnal urination.  Endo: Negative for unusual weight change.    Physical Examination:   BP 123/81   Pulse 84   Ht 5\' 5"  (1.651 m)   Wt 252 lb 3.2 oz (114.4 kg)   BMI 41.97 kg/m   General: Well-nourished, well-developed in no acute distress.  Eyes: No icterus. Conjunctivae pink. Mouth: Oropharyngeal mucosa moist and pink , no lesions erythema or exudate. Neck: Supple, Trachea midline Abdomen: Bowel sounds are normal, nontender, nondistended, no hepatosplenomegaly or masses, no abdominal bruits or hernia , no rebound or guarding.   Extremities: No lower extremity edema. No clubbing or deformities. Neuro: Alert and oriented x 3.  Grossly intact. Skin: Warm and dry, no jaundice.   Psych: Alert and cooperative, normal mood and affect.   Labs: CMP     Component Value Date/Time   NA 137 05/10/2017 1002   NA 140 07/04/2012 1500   K 3.5 05/10/2017 1002   K 3.8 07/04/2012 1500   CL 102 05/10/2017 1002   CL 110 (H) 07/04/2012 1500   CO2 25 05/10/2017 1002   CO2 25 07/04/2012 1500  GLUCOSE 94 05/10/2017 1002   GLUCOSE 93 07/04/2012 1500   BUN 17 05/10/2017 1002   BUN 9 07/04/2012 1500   CREATININE 1.09 (H) 05/10/2017 1002   CREATININE 0.88 07/04/2012 1500   CALCIUM 9.2 05/10/2017 1002   CALCIUM 9.2 07/04/2012 1500   PROT 7.9 11/19/2015 1320   PROT 8.2 07/04/2012 1500   ALBUMIN 4.3 11/19/2015 1320   ALBUMIN 4.1 07/04/2012 1500   AST 23 11/19/2015 1320   AST 37 07/04/2012 1500   ALT 16 11/19/2015 1320   ALT 36 07/04/2012 1500   ALKPHOS 43 11/19/2015 1320   ALKPHOS 84  07/04/2012 1500   BILITOT 1.2 11/19/2015 1320   BILITOT 0.6 07/04/2012 1500   GFRNONAA 58 (L) 05/10/2017 1002   GFRNONAA >60 07/04/2012 1500   GFRAA >60 05/10/2017 1002   GFRAA >60 07/04/2012 1500   Lab Results  Component Value Date   WBC 12.6 (H) 05/10/2017   HGB 12.3 05/10/2017   HCT 38.0 05/10/2017   MCV 89.8 05/10/2017   PLT 349 05/10/2017    Imaging Studies: No results found.  Assessment and Plan:   Lauren Collier is a 52 y.o. y/o female here for follow-up of acid reflux, well controlled at this time with H2 RA  Due to patient's report of intermittent dysphasia.  Recently, which is now well controlled with PPI previously and now H2 RA, an EGD is indicated to rule out EOE We will schedule this along with screening colonoscopy Patient educated extensively on acid reflux lifestyle modification, including buying a bed wedge, not eating 3 hrs before bedtime.  She verbalized understanding  I have discussed alternative options, risks & benefits,  which include, but are not limited to, bleeding, infection, perforation,respiratory complication & drug reaction.  The patient agrees with this plan & written consent will be obtained.      Dr Lauren Collier

## 2017-10-05 ENCOUNTER — Ambulatory Visit: Payer: BLUE CROSS/BLUE SHIELD | Admitting: Anesthesiology

## 2017-10-05 ENCOUNTER — Encounter
Admission: RE | Disposition: A | Discharge status: Home / Self Care | Payer: Self-pay | Source: Ambulatory Visit | Attending: Gastroenterology

## 2017-10-05 ENCOUNTER — Encounter: Payer: Self-pay | Admitting: Student

## 2017-10-05 ENCOUNTER — Ambulatory Visit
Admission: RE | Admit: 2017-10-05 | Discharge: 2017-10-05 | Disposition: A | Discharge status: Home / Self Care | Payer: BLUE CROSS/BLUE SHIELD | Source: Ambulatory Visit | Attending: Gastroenterology | Admitting: Gastroenterology

## 2017-10-05 ENCOUNTER — Other Ambulatory Visit: Payer: Self-pay

## 2017-10-05 DIAGNOSIS — K635 Polyp of colon: Secondary | ICD-10-CM | POA: Insufficient documentation

## 2017-10-05 DIAGNOSIS — D124 Benign neoplasm of descending colon: Secondary | ICD-10-CM

## 2017-10-05 DIAGNOSIS — K648 Other hemorrhoids: Secondary | ICD-10-CM

## 2017-10-05 DIAGNOSIS — K2100 Gastro-esophageal reflux disease with esophagitis, without bleeding: Secondary | ICD-10-CM

## 2017-10-05 DIAGNOSIS — Z1211 Encounter for screening for malignant neoplasm of colon: Secondary | ICD-10-CM | POA: Diagnosis not present

## 2017-10-05 DIAGNOSIS — I10 Essential (primary) hypertension: Secondary | ICD-10-CM | POA: Insufficient documentation

## 2017-10-05 DIAGNOSIS — K5730 Diverticulosis of large intestine without perforation or abscess without bleeding: Secondary | ICD-10-CM

## 2017-10-05 DIAGNOSIS — Z79899 Other long term (current) drug therapy: Secondary | ICD-10-CM | POA: Diagnosis not present

## 2017-10-05 DIAGNOSIS — R12 Heartburn: Secondary | ICD-10-CM

## 2017-10-05 DIAGNOSIS — R1310 Dysphagia, unspecified: Secondary | ICD-10-CM | POA: Diagnosis not present

## 2017-10-05 HISTORY — PX: ESOPHAGOGASTRODUODENOSCOPY (EGD) WITH PROPOFOL: SHX5813

## 2017-10-05 HISTORY — DX: Gastro-esophageal reflux disease without esophagitis: K21.9

## 2017-10-05 HISTORY — PX: COLONOSCOPY WITH PROPOFOL: SHX5780

## 2017-10-05 LAB — POCT PREGNANCY, URINE: Preg Test, Ur: NEGATIVE

## 2017-10-05 SURGERY — COLONOSCOPY WITH PROPOFOL
Anesthesia: General

## 2017-10-05 MED ORDER — PROPOFOL 500 MG/50ML IV EMUL
INTRAVENOUS | Status: AC
  Filled 2017-10-05: qty 50

## 2017-10-05 MED ORDER — PROPOFOL 500 MG/50ML IV EMUL
INTRAVENOUS | Status: DC | PRN
  Administered 2017-10-05: 100 ug/kg/min via INTRAVENOUS

## 2017-10-05 MED ORDER — PROPOFOL 10 MG/ML IV BOLUS
INTRAVENOUS | Status: DC | PRN
  Administered 2017-10-05: 50 mg via INTRAVENOUS

## 2017-10-05 MED ORDER — SODIUM CHLORIDE 0.9 % IV SOLN
INTRAVENOUS | Status: DC
  Administered 2017-10-05 (×2): via INTRAVENOUS

## 2017-10-05 NOTE — Anesthesia Post-op Follow-up Note (Signed)
Anesthesia QCDR form completed.        

## 2017-10-05 NOTE — Anesthesia Postprocedure Evaluation (Signed)
Anesthesia Post Note  Patient: Lauren Collier  Procedure(s) Performed: COLONOSCOPY WITH PROPOFOL (N/A ) ESOPHAGOGASTRODUODENOSCOPY (EGD) WITH PROPOFOL (N/A )  Patient location during evaluation: Endoscopy Anesthesia Type: General Level of consciousness: awake and alert Pain management: pain level controlled Vital Signs Assessment: post-procedure vital signs reviewed and stable Respiratory status: spontaneous breathing, nonlabored ventilation, respiratory function stable and patient connected to nasal cannula oxygen Cardiovascular status: blood pressure returned to baseline and stable Postop Assessment: no apparent nausea or vomiting Anesthetic complications: no     Last Vitals:  Vitals:   10/05/17 1155 10/05/17 1200  BP: (!) 150/103 (!) 141/102  Pulse:    Resp: 18 (!) 21  Temp:    SpO2:      Last Pain:  Vitals:   10/05/17 0943  TempSrc: Tympanic                 Precious Haws Piscitello

## 2017-10-05 NOTE — Transfer of Care (Signed)
Immediate Anesthesia Transfer of Care Note  Patient: Lauren Collier  Procedure(s) Performed: COLONOSCOPY WITH PROPOFOL (N/A ) ESOPHAGOGASTRODUODENOSCOPY (EGD) WITH PROPOFOL (N/A )  Patient Location: PACU  Anesthesia Type:General  Level of Consciousness: awake  Airway & Oxygen Therapy: Patient Spontanous Breathing and Patient connected to nasal cannula oxygen  Post-op Assessment: Report given to RN  Post vital signs: Reviewed and stable  Last Vitals:  Vitals:   10/05/17 0943 10/05/17 1140  BP: 125/84   Pulse: 82 80  Resp: 18 14  Temp: (!) 36.1 C (!) 36.4 C  SpO2: 100% 98%    Last Pain:  Vitals:   10/05/17 0943  TempSrc: Tympanic         Complications: No apparent anesthesia complications

## 2017-10-05 NOTE — Op Note (Signed)
[facility] Gastroenterology Patient Name: Lauren Collier Procedure Date: 10/05/2017 10:41 AM MRN: [id number] Account #: [id number] Date of Birth: August 06, 1965 Admit Type: Outpatient Age: 52 Room: [facility] ENDO ROOM 2 Gender: Female Note Status: Finalized Procedure:            Colonoscopy Indications:          Screening for colorectal malignant neoplasm Providers:            Luiza Carranco B. Bonna Gains MD, MD Referring MD:         Bethena Roys. Sowles, MD (Referring MD) Medicines:            Monitored Anesthesia Care Complications:        No immediate complications. Procedure:            Pre-Anesthesia Assessment:                       - ASA Grade Assessment: II - A patient with mild                        systemic disease.                       - Prior to the procedure, a History and Physical was                        performed, and patient medications, allergies and                        sensitivities were reviewed. The patient's tolerance of                        previous anesthesia was reviewed.                       - The risks and benefits of the procedure and the                        sedation options and risks were discussed with the                        patient. All questions were answered and informed                        consent was obtained.                       - Patient identification and proposed procedure were                        verified prior to the procedure by the physician, the                        nurse, the anesthesiologist, the anesthetist and the                        technician. The procedure was verified in the procedure                        room.  After obtaining informed consent, the colonoscope was                        passed under direct vision. Throughout the procedure,                        the patient's blood pressure, pulse, and oxygen                        saturations were monitored continuously. The                        Colonoscope was introduced through the anus and                        advanced to the the cecum, identified by appendiceal                        orifice and ileocecal valve. The colonoscopy was                        performed with ease. The patient tolerated the                        procedure well. The quality of the bowel preparation                        was good. Findings:      The perianal and digital rectal examinations were normal.      Two sessile polyps were found in the descending colon. The polyps were 2       to 3 mm in size. These polyps were removed with a jumbo cold forceps.       Resection and retrieval were complete.      A few small-mouthed diverticula were found in the sigmoid colon.      The exam was otherwise without abnormality.      The rectum, sigmoid colon, descending colon, transverse colon, ascending       colon and cecum appeared normal.      Non-bleeding internal hemorrhoids were found during retroflexion. The       hemorrhoids were moderate. Impression:           - Two 2 to 3 mm polyps in the descending colon, removed                        with a jumbo cold forceps. Resected and retrieved.                       - Diverticulosis in the sigmoid colon.                       - The examination was otherwise normal.                       - The rectum, sigmoid colon, descending colon,                        transverse colon, ascending colon and cecum are normal.                       -  Non-bleeding internal hemorrhoids. Recommendation:       - Discharge patient to home (with escort).                       - Advance diet as tolerated.                       - Continue present medications.                       - Await pathology results.                       - Repeat colonoscopy date to be determined after                        pending pathology results are reviewed for surveillance.                       - The findings and  recommendations were discussed with                        the patient.                       - The findings and recommendations were discussed with                        the patient's family.                       - Return to primary care physician as previously                        scheduled.                       - High fiber diet. Procedure Code(s):    --- Professional ---                       [phone], Colonoscopy, flexible; with biopsy, single or                        multiple Diagnosis Code(s):    --- Professional ---                       Z12.11, Encounter for screening for malignant neoplasm                        of colon                       D12.4, Benign neoplasm of descending colon                       K64.8, Other hemorrhoids                       K57.30, Diverticulosis of large intestine without                        perforation or abscess without bleeding CPT copyright 2016 American Medical Association. All rights reserved. The codes documented in this report are preliminary and upon coder  review may  be revised to meet current compliance requirements.  [name], MD Margretta Sidle B. Bonna Gains MD, MD 10/05/2017 11:41:30 AM This report has been signed electronically. Number of Addenda: 0 Note Initiated On: 10/05/2017 10:41 AM Scope Withdrawal Time: 0 hours 21 minutes 29 seconds  Total Procedure Duration: 0 hours 35 minutes 18 seconds  Estimated Blood Loss: Estimated blood loss: none.      [facility]

## 2017-10-05 NOTE — H&P (Signed)
Lauren Antigua, MD [address], [place], [place], [place], [address] [id number], [place], [place], [place], [address] Phone: [phone]  Fax: [phone]  Primary Care Physician:  [facility], [place]   Pre-Procedure History & Physical: HPI:  Lauren Collier is a 52 y.o. female is here for a colonoscopy and EGD.   Past Medical History:  Diagnosis Date  . GERD (gastroesophageal reflux disease)   . Hypertension     Past Surgical History:  Procedure Laterality Date  . APPENDECTOMY    . CESAREAN SECTION    . tummy tuck      Prior to Admission medications   Medication Sig Start Date End Date Taking? Authorizing Provider  amLODipine (NORVASC) 5 MG tablet Take 5 mg by mouth daily.   Yes [provider]  losartan (COZAAR) 100 MG tablet Take 100 mg by mouth daily.   Yes [provider]  ranitidine (ZANTAC) 150 MG tablet Take 150 mg by mouth daily.   Yes [provider]  cyclobenzaprine (FLEXERIL) 10 MG tablet Take 1 tablet (10 mg total) by mouth 3 (three) times daily as needed for muscle spasms. Patient not taking: Reported on 09/21/2017 07/16/17   Ocie Cornfield T, PA-C  naproxen (NAPROSYN) 375 MG tablet Take 1 tablet (375 mg total) by mouth 2 (two) times daily. Patient not taking: Reported on 10/05/2017 07/16/17   Ocie Cornfield T, PA-C  omeprazole (PRILOSEC) 20 MG capsule Take 1 capsule (20 mg total) daily by mouth. 06/15/17 07/15/17  Virgel Manifold, MD  OVER THE COUNTER MEDICATION Take 1 tablet by mouth daily. Female vitamin complex    [provider]    Allergies as of 09/21/2017  . (No Known Allergies)    Family History  Problem Relation Age of Onset  . Breast cancer Mother 41  . Diabetes Mother   . Heart disease Sister   . Colon cancer Maternal Grandmother   . Ovarian cancer Cousin        maternal    Social History   Socioeconomic History  . Marital status: Single    Spouse name: Not on file  .  Number of children: Not on file  . Years of education: Not on file  . Highest education level: Not on file  Social Needs  . Financial resource strain: Not on file  . Food insecurity - worry: Not on file  . Food insecurity - inability: Not on file  . Transportation needs - medical: Not on file  . Transportation needs - non-medical: Not on file  Occupational History  . Not on file  Tobacco Use  . Smoking status: Never Smoker  . Smokeless tobacco: Never Used  Substance and Sexual Activity  . Alcohol use: Yes    Comment: social  . Drug use: No  . Sexual activity: Yes    Partners: Male    Birth control/protection: None  Other Topics Concern  . Not on file  Social History Narrative  . Not on file    Review of Systems: See HPI, otherwise negative ROS  Physical Exam: BP 125/84   Pulse 82   Temp (!) 97 F (36.1 C) (Tympanic)   Resp 18   Ht 5\' 6"  (1.676 m)   Wt 252 lb (114.3 kg)   SpO2 100%   BMI 40.67 kg/m  General:   Alert,  pleasant and cooperative in NAD Head:  Normocephalic and atraumatic. Neck:  Supple; no masses or thyromegaly. Lungs:  Clear throughout to auscultation, normal  respiratory effort.    Heart:  +S1, +S2, Regular rate and rhythm, No edema. Abdomen:  Soft, nontender and nondistended. Normal bowel sounds, without guarding, and without rebound.   Neurologic:  Alert and  oriented x4;  grossly normal neurologically.  Impression/Plan: Lauren Collier is here for a colonoscopy to be performed for average risk screening and EGD for Acid Reflux and dysphagia.  Risks, benefits, limitations, and alternatives regarding the procedures have been reviewed with the patient.  Questions have been answered.  All parties agreeable.   Virgel Manifold, MD  10/05/2017, 9:55 AM

## 2017-10-05 NOTE — Anesthesia Preprocedure Evaluation (Signed)
Anesthesia Evaluation  Patient identified by MRN, date of birth, ID band Patient awake    Reviewed: Allergy & Precautions, H&P , NPO status , Patient's Chart, lab work & pertinent test results  History of Anesthesia Complications Negative for: history of anesthetic complications  Airway Mallampati: II  TM Distance: >3 FB Neck ROM: full    Dental  (+) Chipped, Poor Dentition, Missing   Pulmonary neg pulmonary ROS, neg shortness of breath,           Cardiovascular Exercise Tolerance: Good hypertension, (-) angina(-) Past MI and (-) DOE      Neuro/Psych negative neurological ROS  negative psych ROS   GI/Hepatic Neg liver ROS, GERD  Medicated and Controlled,  Endo/Other  negative endocrine ROS  Renal/GU negative Renal ROS  negative genitourinary   Musculoskeletal   Abdominal   Peds  Hematology negative hematology ROS (+)   Anesthesia Other Findings Past Medical History: No date: GERD (gastroesophageal reflux disease) No date: Hypertension  Past Surgical History: No date: APPENDECTOMY No date: CESAREAN SECTION No date: tummy tuck  BMI    Body Mass Index:  40.67 kg/m      Reproductive/Obstetrics negative OB ROS                             Anesthesia Physical Anesthesia Plan  ASA: III  Anesthesia Plan: General   Post-op Pain Management:    Induction: Intravenous  PONV Risk Score and Plan: Propofol infusion  Airway Management Planned: Natural Airway and Nasal Cannula  Additional Equipment:   Intra-op Plan:   Post-operative Plan:   Informed Consent: I have reviewed the patients History and Physical, chart, labs and discussed the procedure including the risks, benefits and alternatives for the proposed anesthesia with the patient or authorized representative who has indicated his/her understanding and acceptance.   Dental Advisory Given  Plan Discussed with:  Anesthesiologist, CRNA and Surgeon  Anesthesia Plan Comments: (Patient consented for risks of anesthesia including but not limited to:  - adverse reactions to medications - risk of intubation if required - damage to teeth, lips or other oral mucosa - sore throat or hoarseness - Damage to heart, brain, lungs or loss of life  Patient voiced understanding.)        Anesthesia Quick Evaluation

## 2017-10-05 NOTE — Op Note (Signed)
[facility] Gastroenterology Patient Name: Lauren Collier Procedure Date: 10/05/2017 10:42 AM MRN: [id number] Account #: [id number] Date of Birth: 1965/10/27 Admit Type: Outpatient Age: 52 Room: [facility] ENDO ROOM 2 Gender: Female Note Status: Finalized Procedure:            Upper GI endoscopy Indications:          Dysphagia, Heartburn Providers:            Varnita B. Bonna Gains MD, MD Referring MD:         Bethena Roys. Sowles, MD (Referring MD) Medicines:            Monitored Anesthesia Care Complications:        No immediate complications. Procedure:            Pre-Anesthesia Assessment:                       - The risks and benefits of the procedure and the                        sedation options and risks were discussed with the                        patient. All questions were answered and informed                        consent was obtained.                       - Patient identification and proposed procedure were                        verified prior to the procedure.                       - ASA Grade Assessment: II - A patient with mild                        systemic disease.                       After obtaining informed consent, the endoscope was                        passed under direct vision. Throughout the procedure,                        the patient's blood pressure, pulse, and oxygen                        saturations were monitored continuously. The Endoscope                        was introduced through the mouth, and advanced to the                        second part of duodenum. The upper GI endoscopy was                        accomplished with ease. The patient tolerated the  procedure well. Findings:      LA Grade A (one or more mucosal breaks less than 5 mm, not extending       between tops of 2 mucosal folds) esophagitis with no bleeding was found       in the distal esophagus.      Mucosal changes including white  plaques and circumferential folds were       found in the mid esophagus. Biopsies were obtained from the proximal and       distal esophagus with cold forceps for histology of suspected       eosinophilic esophagitis.      There is no endoscopic evidence of stenosis or stricture in the entire       esophagus.      The entire examined stomach was normal.      The examined duodenum was normal. Impression:           - LA Grade A reflux esophagitis.                       - Esophageal mucosal changes suggestive of eosinophilic                        esophagitis. Biopsied.                       - Normal stomach.                       - Normal examined duodenum. Recommendation:       - Await pathology results.                       - Follow an antireflux regimen.                       - Use Prilosec (omeprazole) 20 mg PO daily.                       - Use a bed wedge at night and do not eat 3 hrs before                        bedtime to prevent acid reflux.                       - Return to my office in 4 weeks.                       - Discharge patient to home.                       - Continue present medications.                       - Return to primary care physician in 4 weeks.                       - The findings and recommendations were discussed with                        the patient.                       - The findings and  recommendations were discussed with                        the patient's family. Procedure Code(s):    --- Professional ---                       [phone], Esophagogastroduodenoscopy, flexible, transoral;                        with biopsy, single or multiple Diagnosis Code(s):    --- Professional ---                       K21.0, Gastro-esophageal reflux disease with esophagitis                       R13.10, Dysphagia, unspecified                       R12, Heartburn CPT copyright 2016 American Medical Association. All rights reserved. The codes documented in  this report are preliminary and upon coder review may  be revised to meet current compliance requirements.  [name], MD Margretta Sidle B. Bonna Gains MD, MD 10/05/2017 10:58:44 AM This report has been signed electronically. Number of Addenda: 0 Note Initiated On: 10/05/2017 10:42 AM      [facility]

## 2017-10-06 ENCOUNTER — Encounter: Payer: Self-pay | Admitting: Gastroenterology

## 2017-10-06 LAB — SURGICAL PATHOLOGY

## 2017-10-12 ENCOUNTER — Encounter: Payer: Self-pay | Admitting: Gastroenterology

## 2017-12-21 ENCOUNTER — Ambulatory Visit: Payer: BLUE CROSS/BLUE SHIELD | Admitting: Gastroenterology

## 2017-12-28 ENCOUNTER — Encounter: Payer: Self-pay | Admitting: Gastroenterology

## 2017-12-28 ENCOUNTER — Ambulatory Visit: Payer: BLUE CROSS/BLUE SHIELD | Admitting: Gastroenterology

## 2018-03-29 ENCOUNTER — Ambulatory Visit: Payer: BLUE CROSS/BLUE SHIELD | Admitting: Obstetrics and Gynecology

## 2018-03-29 ENCOUNTER — Encounter: Payer: Self-pay | Admitting: Obstetrics and Gynecology

## 2018-03-29 VITALS — BP 125/84 | HR 82 | Ht 66.0 in | Wt 243.8 lb

## 2018-03-29 DIAGNOSIS — N951 Menopausal and female climacteric states: Secondary | ICD-10-CM | POA: Diagnosis not present

## 2018-03-29 DIAGNOSIS — N952 Postmenopausal atrophic vaginitis: Secondary | ICD-10-CM

## 2018-03-29 DIAGNOSIS — E669 Obesity, unspecified: Secondary | ICD-10-CM

## 2018-03-29 DIAGNOSIS — R6882 Decreased libido: Secondary | ICD-10-CM

## 2018-03-29 DIAGNOSIS — F439 Reaction to severe stress, unspecified: Secondary | ICD-10-CM | POA: Diagnosis not present

## 2018-03-29 NOTE — Patient Instructions (Addendum)
1.  Begin IMVEXXEY 4 mcg twice weekly intravaginal. 2.  Blood work is drawn today including testosterone (free and total), sex hormone binding globulin, DHEAS, and estradiol levels 3.  Consider over-the-counter lubricants including:  Virgin olive oil  Coconut oil  Astroglide  Jo H2O lubricant 4.  Consider mindfulness therapy-flyer given for [facility].  Consider using the think acronym:  T-truthfulness  H-helpful  I-inspiring  N-necessary  K-kind 5.  Return in 6 weeks for follow-up

## 2018-03-30 NOTE — Progress Notes (Signed)
GYN ENCOUNTER NOTE  Subjective:       Lauren Collier is a 52 y.o. [phone] female is here for gynecologic evaluation of the following issues:  1.  Decreased libido.    51 year old recently remarried (5 years ago) African-American female para 7-0-0-7, perimenopausal, on no HRT therapy, without significant vasomotor symptoms, presents for evaluation of decreased libido.  Patient states that she has noticed symptoms progressively worsening over the past 4 weeks.  She has had decreased desire, decreased sexual thought, and decreased orgasm frequency. Patient notes increased vaginal dryness without significant pelvic pain during intercourse.  Cycles are irregular.  Comprehensive discussion regarding complex factors contributing to female libido were addressed including the following:  Relationship issues  Children issues  Work issues  Perimenopause  Body image  Hormonal issues including declining estrogen, progesterone, and testosterone  Physical comorbidities  Medications  Patient relates no significant anger or resentment toward spouse, although intimate medication may be a challenge. Patient reports significant child issues with several children and niece living in the house house recently and contributing to significant day-to-day stress Menopausal vaginal atrophy is an issue. Increased BMI of 39.9 and body self-image likewise impact patient's sense of wellness and confidence. Hypertension is the only physical comorbidity besides obesity and menopause    Gynecologic History Patient's last menstrual period was 03/02/2018. Contraception: none Last Pap: 02/12/2016 NEGATIVE/NEGATIVE Last mammogram: 09/04/2015 BI-RADS 1  Obstetric History OB History  Gravida Para Term Preterm AB Living  7 7 7     7   SAB TAB Ectopic Multiple Live Births          7    # Outcome Date GA Lbr Len/2nd Weight Sex Delivery Anes PTL Lv  7 Term 2008   6 lb (2.722 kg) F Vag-Spont   LIV  6 Term 2006   7 lb  (3.175 kg) M Vag-Spont   LIV  5 Term 1999   6 lb (2.722 kg) F Vag-Spont   LIV  4 Term 1997   9 lb (4.082 kg) F Vag-Spont   LIV  3 Term 1991   9 lb (4.082 kg) F CS-LTranv   LIV     Complications: Breech birth  2 Term 1986   8 lb 2.6 oz (3.701 kg) M Vag-Spont   LIV  1 Term 1983   7 lb (3.175 kg) M Vag-Spont   LIV    Past Medical History:  Diagnosis Date  . GERD (gastroesophageal reflux disease)   . Hypertension     Past Surgical History:  Procedure Laterality Date  . APPENDECTOMY    . CESAREAN SECTION    . COLONOSCOPY WITH PROPOFOL N/A 10/05/2017   Procedure: COLONOSCOPY WITH PROPOFOL;  Surgeon: Virgel Manifold, MD;  Location: [facility] ENDOSCOPY;  Service: Endoscopy;  Laterality: N/A;  . ESOPHAGOGASTRODUODENOSCOPY (EGD) WITH PROPOFOL N/A 10/05/2017   Procedure: ESOPHAGOGASTRODUODENOSCOPY (EGD) WITH PROPOFOL;  Surgeon: Virgel Manifold, MD;  Location: [facility] ENDOSCOPY;  Service: Endoscopy;  Laterality: N/A;  . tummy tuck      Current Outpatient Medications on File Prior to Visit  Medication Sig Dispense Refill  . losartan (COZAAR) 100 MG tablet Take 100 mg by mouth daily.     No current facility-administered medications on file prior to visit.     No Known Allergies  Social History   Socioeconomic History  . Marital status: Single    Spouse name: Not on file  . Number of children: Not on file  . Years of education: Not on file  .  Highest education level: Not on file  Occupational History  . Not on file  Social Needs  . Financial resource strain: Not on file  . Food insecurity:    Worry: Not on file    Inability: Not on file  . Transportation needs:    Medical: Not on file    Non-medical: Not on file  Tobacco Use  . Smoking status: Never Smoker  . Smokeless tobacco: Never Used  Substance and Sexual Activity  . Alcohol use: Yes    Comment: social  . Drug use: No  . Sexual activity: Yes    Partners: Male    Birth control/protection: None  Lifestyle  . Physical  activity:    Days per week: Not on file    Minutes per session: Not on file  . Stress: Not on file  Relationships  . Social connections:    Talks on phone: Not on file    Gets together: Not on file    Attends religious service: Not on file    Active member of club or organization: Not on file    Attends meetings of clubs or organizations: Not on file    Relationship status: Not on file  . Intimate partner violence:    Fear of current or ex partner: Not on file    Emotionally abused: Not on file    Physically abused: Not on file    Forced sexual activity: Not on file  Other Topics Concern  . Not on file  Social History Narrative  . Not on file    Family History  Problem Relation Age of Onset  . Breast cancer Mother 83  . Diabetes Mother   . Heart disease Sister   . Colon cancer Maternal Grandmother   . Ovarian cancer Cousin        maternal    The following portions of the patient's history were reviewed and updated as appropriate: allergies, current medications, past family history, past medical history, past social history, past surgical history and problem list.  Review of Systems Review of Systems  Constitutional: Positive for weight loss. Negative for diaphoresis and malaise/fatigue.  HENT: Negative.   Eyes: Negative.   Respiratory: Negative.   Cardiovascular: Negative.   Gastrointestinal: Negative.   Genitourinary:       Menses irregular Vaginal dryness  Musculoskeletal: Negative.   Skin: Negative.   Neurological: Negative.   Endo/Heme/Allergies: Negative.   Psychiatric/Behavioral: The patient is nervous/anxious.     Objective:   BP 125/84   Pulse 82   Ht 5\' 6"  (1.676 m)   Wt 243 lb 12.8 oz (110.6 kg)   LMP 03/02/2018   BMI 39.35 kg/m  CONSTITUTIONAL: Well-developed, well-nourished female in no acute distress.  HENT:  Normocephalic, atraumatic.  NECK: Normal range of motion, supple, no masses.  Normal thyroid.  SKIN: Skin is warm and dry. No rash  noted. Not diaphoretic. No erythema. No pallor. [place]: Alert and oriented to person, place, and time.  PSYCHIATRIC: Normal mood and affect. Normal behavior. Normal judgment and thought content.  Slightly anxious with self-awareness of personal issues CARDIOVASCULAR:Not Examined RESPIRATORY: Not Examined BREASTS: Not Examined ABDOMEN: Soft, non distended; Non tender.  No Organomegaly. PELVIC:  External Genitalia: Normal  BUS: Normal  Vagina: Fair estrogen effect; no discharge  Cervix: Normal; no cervical motion tenderness  Uterus: Normal size, shape,consistency, mobile  Adnexa: Normal; nonpalpable nontender  RV: Normal external exam  Bladder: Nontender MUSCULOSKELETAL: Normal range of motion. No tenderness.  No cyanosis,  clubbing, or edema.     Assessment:   1. Decreased libido-multiple factors contributing to stress including perimenopause, vaginal dryness, body self-image, and family stressors with children living in home.  Cannot rule out possible hormonal contributing factor - Testosterone, Free, Total, SHBG - Estradiol - DHEA-sulfate  2. Stress at home  3.  Climacteric  4. Vaginal atrophy, mild  5. Vaginal dryness, menopausal  6. Obesity (BMI 35.0-39.9 without comorbidity)     Plan:   1.  Lab work: Testosterone, free and total; sex hormone binding globulin; estradiol; DHEAS 2.  Healthy eating with exercise and control weight loss is encouraged 3.  Extensive discussion regarding mindfulness and consideration of mindfulness counseling-literature given on [facility]  THINK acronym discussed and encouraged to be used when dealing with conflict at home  T-truthfulness  H-helpful  I-inspiring  N-necessary  K-kind 4.  Trial of IMVEXXEY 4 mcg intravaginal twice weekly 5.  Return in 6 weeks for follow-up  A total of 25 minutes were spent face-to-face with the patient during this encounter and over half of that time involved counseling and coordination  of care.  Brayton Mars, MD  Note: This dictation was prepared with Dragon dictation along with smaller phrase technology. Any transcriptional errors that result from this process are unintentional.

## 2018-03-31 ENCOUNTER — Telehealth: Payer: Self-pay | Admitting: Obstetrics and Gynecology

## 2018-03-31 LAB — ESTRADIOL: Estradiol: 40.4 pg/mL

## 2018-03-31 LAB — TESTOSTERONE, FREE, TOTAL, SHBG
SEX HORMONE BINDING: 39.5 nmol/L (ref 17.3–125.0)
TESTOSTERONE FREE: 0.9 pg/mL (ref 0.0–4.2)
TESTOSTERONE: 7 ng/dL (ref 3–41)

## 2018-03-31 LAB — DHEA-SULFATE: DHEA-SO4: 50.3 ug/dL (ref 41.2–243.7)

## 2018-03-31 NOTE — Telephone Encounter (Signed)
The patient called and stated that she would like to speak with her nurse in regards to her lab results. Please advise.

## 2018-04-17 NOTE — Telephone Encounter (Signed)
LMTRC

## 2018-04-18 NOTE — Telephone Encounter (Signed)
Pt aware.

## 2018-04-25 ENCOUNTER — Encounter: Payer: Self-pay | Admitting: Obstetrics and Gynecology

## 2018-04-27 NOTE — Progress Notes (Signed)
Patient ID: Lauren Collier, female   DOB: 10-19-1965, 52 y.o.   MRN: [id number] ANNUAL PREVENTATIVE CARE GYN  ENCOUNTER NOTE  Subjective:       Lauren Collier is a 52 y.o. [phone] female here for a routine annual gynecologic exam.  Current complaints:  1.  Review lab results; lab work demonstrates low normal free and total testosterone levels and low estrogen level.  SHBG level is normal. Patient has been using vaginal estrogen therapy and has noted improvement with lubrication.  She still has issues with decreased libido.  Patient states that the previous adults living with she and her husband have now moved out and distressed the environment.  She is interested in proceeding with a non-FDA approved trial of testosterone.  Cycles are regular on a monthly basis. No vasomotor symptoms.    Gynecologic History LMP:04/24/2018 Contraception: none Last Pap: 01/2016 neg/neg. Results were: normal Last mammogram: 2018 -[facility]l [employer]ur employer Results were: normal  Obstetric History OB History  Gravida Para Term Preterm AB Living  7 7 7     7   SAB TAB Ectopic Multiple Live Births          7    # Outcome Date GA Lbr Len/2nd Weight Sex Delivery Anes PTL Lv  7 Term 2008   6 lb (2.722 kg) F Vag-Spont   LIV  6 Term 2006   7 lb (3.175 kg) M Vag-Spont   LIV  5 Term 1999   6 lb (2.722 kg) F Vag-Spont   LIV  4 Term 1997   9 lb (4.082 kg) F Vag-Spont   LIV  3 Term 1991   9 lb (4.082 kg) F CS-LTranv   LIV     Complications: Breech birth  2 Term 1986   8 lb 2.6 oz (3.701 kg) M Vag-Spont   LIV  1 Term 1983   7 lb (3.175 kg) M Vag-Spont   LIV    Past Medical History:  Diagnosis Date  . GERD (gastroesophageal reflux disease)   . Hypertension     Past Surgical History:  Procedure Laterality Date  . APPENDECTOMY    . CESAREAN SECTION    . COLONOSCOPY WITH PROPOFOL N/A 10/05/2017   Procedure: COLONOSCOPY WITH PROPOFOL;  Surgeon: Virgel Manifold, MD;  Location: [facility] ENDOSCOPY;  Service: Endoscopy;   Laterality: N/A;  . ESOPHAGOGASTRODUODENOSCOPY (EGD) WITH PROPOFOL N/A 10/05/2017   Procedure: ESOPHAGOGASTRODUODENOSCOPY (EGD) WITH PROPOFOL;  Surgeon: Virgel Manifold, MD;  Location: [facility] ENDOSCOPY;  Service: Endoscopy;  Laterality: N/A;  . tummy tuck      Current Outpatient Medications on File Prior to Visit  Medication Sig Dispense Refill  . losartan (COZAAR) 100 MG tablet Take 100 mg by mouth daily.     No current facility-administered medications on file prior to visit.     No Known Allergies  Social History   Socioeconomic History  . Marital status: Single    Spouse name: Not on file  . Number of children: Not on file  . Years of education: Not on file  . Highest education level: Not on file  Occupational History  . Not on file  Social Needs  . Financial resource strain: Not on file  . Food insecurity:    Worry: Not on file    Inability: Not on file  . Transportation needs:    Medical: Not on file    Non-medical: Not on file  Tobacco Use  . Smoking status: Never Smoker  . Smokeless  tobacco: Never Used  Substance and Sexual Activity  . Alcohol use: Yes    Comment: social  . Drug use: No  . Sexual activity: Yes    Partners: Male    Birth control/protection: None  Lifestyle  . Physical activity:    Days per week: Not on file    Minutes per session: Not on file  . Stress: Not on file  Relationships  . Social connections:    Talks on phone: Not on file    Gets together: Not on file    Attends religious service: Not on file    Active member of club or organization: Not on file    Attends meetings of clubs or organizations: Not on file    Relationship status: Not on file  . Intimate partner violence:    Fear of current or ex partner: Not on file    Emotionally abused: Not on file    Physically abused: Not on file    Forced sexual activity: Not on file  Other Topics Concern  . Not on file  Social History Narrative  . Not on file    Family History   Problem Relation Age of Onset  . Breast cancer Mother 66  . Diabetes Mother   . Heart disease Sister   . Colon cancer Maternal Grandmother   . Ovarian cancer Cousin        maternal    The following portions of the patient's history were reviewed and updated as appropriate: allergies, current medications, past family history, past medical history, past social history, past surgical history and problem list.  Review of Systems Review of Systems  Constitutional: Negative.   HENT: Negative.   Eyes: Negative.   Respiratory: Negative.   Cardiovascular: Negative.   Gastrointestinal: Negative.   Genitourinary: Negative.        Vaginal dryness improved with estrogen therapy, topical  Musculoskeletal: Negative.   Skin: Negative.   Neurological: Negative.   Psychiatric/Behavioral: Negative.     Objective:   BP 132/85   Pulse 78   Ht 5\' 6"  (1.676 m)   Wt 250 lb 4.8 oz (113.5 kg)   LMP 04/24/2018 (Exact Date)   BMI 40.40 kg/m  CONSTITUTIONAL: Well-developed, well-nourished female in no acute distress.  PSYCHIATRIC: Normal mood and affect. Normal behavior. Normal judgment and thought content. [place]: Alert and oriented to person, place, and time. Normal muscle tone coordination. No cranial nerve deficit noted. HENT:  Normocephalic, atraumatic, External right and left ear normal.  EYES: Conjunctivae and EOM are normal.No scleral icterus.  NECK: Normal range of motion, supple, no masses.  Normal thyroid.  SKIN: Skin is warm and dry. No rash noted. Not diaphoretic. No erythema. No pallor. CARDIOVASCULAR: Normal heart rate noted, regular rhythm, no murmur. RESPIRATORY: Clear to auscultation bilaterally. Effort and breath sounds normal, no problems with respiration noted. BREASTS: Symmetric in size. No masses, skin changes, nipple drainage, or lymphadenopathy. ABDOMEN: Soft, normal bowel sounds, no distention noted.  No tenderness, rebound or guarding.  BLADDER:  Normal PELVIC:  External Genitalia: Normal  BUS: Normal  Vagina: Normal estrogen effect; no discharge  Cervix: Normal; parous, no lesions; no cervical motion tenderness  Uterus: Normal; midplane, normal size and shape, mobile, nontender  Adnexa: Normal; nonpalpable nontender  RV: External Exam NormaI, No Rectal Masses and Normal Sphincter tone  MUSCULOSKELETAL: Normal range of motion. No tenderness.  No cyanosis, clubbing, or edema.  2+ distal pulses. LYMPHATIC: No Axillary, Supraclavicular, or Inguinal Adenopathy.  Assessment:   Annual gynecologic examination 52 y.o. Contraception: none bmi- 40 Decreased libido  Plan:  Pap:Due 2020 Mammogram: ordered Stool Guaiac Testing:   Colonoscopy- 09/2017 wnl Labs: thru your employer  Routine preventative health maintenance measures emphasized: Exercise/Diet/Weight control, Tobacco Warnings and Alcohol/Substance use risks Return to Clinic - 1 Year Continue with [facility] twice weekly intravaginal Testosterone cypionate 100 mg IM monthly x3 Return in 4 weeks for follow-up on decreased libido  I Crystal Miller,CMA  am acting as a Education administrator for [employer].  I have reviewed and concur with documentation placed by Joyice Faster, CMA  Joyice Faster, CMA  Brayton Mars, MD   Note: This dictation was prepared with Dragon dictation along with smaller phrase technology. Any transcriptional errors that result from this process are unintentional.

## 2018-05-02 ENCOUNTER — Encounter: Payer: Self-pay | Admitting: Obstetrics and Gynecology

## 2018-05-02 ENCOUNTER — Ambulatory Visit: Payer: BLUE CROSS/BLUE SHIELD | Admitting: Obstetrics and Gynecology

## 2018-05-02 VITALS — BP 132/85 | HR 78 | Ht 66.0 in | Wt 250.3 lb

## 2018-05-02 DIAGNOSIS — Z01419 Encounter for gynecological examination (general) (routine) without abnormal findings: Secondary | ICD-10-CM | POA: Diagnosis not present

## 2018-05-02 DIAGNOSIS — R6882 Decreased libido: Secondary | ICD-10-CM

## 2018-05-02 DIAGNOSIS — N952 Postmenopausal atrophic vaginitis: Secondary | ICD-10-CM

## 2018-05-02 DIAGNOSIS — Z1231 Encounter for screening mammogram for malignant neoplasm of breast: Secondary | ICD-10-CM | POA: Diagnosis not present

## 2018-05-02 DIAGNOSIS — E669 Obesity, unspecified: Secondary | ICD-10-CM | POA: Diagnosis not present

## 2018-05-02 DIAGNOSIS — N951 Menopausal and female climacteric states: Secondary | ICD-10-CM

## 2018-05-02 MED ORDER — ESTRADIOL 4 MCG VA INST: 4.0000 ug | VAGINAL_INSERT | VAGINAL | 11 refills | Status: DC

## 2018-05-02 MED ORDER — TESTOSTERONE CYPIONATE 200 MG/ML IM SOLN: 100.0000 mg | INTRAMUSCULAR | 0 refills | Status: DC

## 2018-05-02 NOTE — Patient Instructions (Addendum)
1.  No Pap smear done.  Next Pap smear is due 2020 2.  Mammogram is ordered. 3.  Stool guaiac cards are not given due to recent colonoscopy this year. 3.  Screening labs are obtained today. 4.  Continue with IMVEXXY twice weekly intravaginal 5.  Trial of testosterone cypionate 100 mg IM monthly x3 6.  Return in 4 weeks for follow-up on decreased libido 7.  Continue with healthy eating and exercise with controlled weight loss.  Weight loss goal is 1 pound per month. 8.  Return in 1 year for annual exam.  Health Maintenance, Female Adopting a healthy lifestyle and getting preventive care can go a long way to promote health and wellness. Talk with your health care provider about what schedule of regular examinations is right for you. This is a good chance for you to check in with your provider about disease prevention and staying healthy. In between checkups, there are plenty of things you can do on your own. Experts have done a lot of research about which lifestyle changes and preventive measures are most likely to keep you healthy. Ask your health care provider for more information. Weight and diet Eat a healthy diet  Be sure to include plenty of vegetables, fruits, low-fat dairy products, and lean protein.  Do not eat a lot of foods high in solid fats, added sugars, or salt.  Get regular exercise. This is one of the most important things you can do for your health. ? Most adults should exercise for at least 150 minutes each week. The exercise should increase your heart rate and make you sweat (moderate-intensity exercise). ? Most adults should also do strengthening exercises at least twice a week. This is in addition to the moderate-intensity exercise.  Maintain a healthy weight  Body mass index (BMI) is a measurement that can be used to identify possible weight problems. It estimates body fat based on height and weight. Your health care provider can help determine your BMI and help you  achieve or maintain a healthy weight.  For females 10 years of age and older: ? A BMI below 18.5 is considered underweight. ? A BMI of 18.5 to 24.9 is normal. ? A BMI of 25 to 29.9 is considered overweight. ? A BMI of 30 and above is considered obese.  Watch levels of cholesterol and blood lipids  You should start having your blood tested for lipids and cholesterol at 51 years of age, then have this test every 5 years.  You may need to have your cholesterol levels checked more often if: ? Your lipid or cholesterol levels are high. ? You are older than 51 years of age. ? You are at high risk for heart disease.  Cancer screening Lung Cancer  Lung cancer screening is recommended for adults 60-19 years old who are at high risk for lung cancer because of a history of smoking.  A yearly low-dose CT scan of the lungs is recommended for people who: ? Currently smoke. ? Have quit within the past 15 years. ? Have at least a 30-pack-year history of smoking. A pack year is smoking an average of one pack of cigarettes a day for 1 year.  Yearly screening should continue until it has been 15 years since you quit.  Yearly screening should stop if you develop a health problem that would prevent you from having lung cancer treatment.  Breast Cancer  Practice breast self-awareness. This means understanding how your breasts normally appear and feel.  It also means doing regular breast self-exams. Let your health care provider know about any changes, no matter how small.  If you are in your 20s or 30s, you should have a clinical breast exam (CBE) by a health care provider every 1-3 years as part of a regular health exam.  If you are 51 or older, have a CBE every year. Also consider having a breast X-ray (mammogram) every year.  If you have a family history of breast cancer, talk to your health care provider about genetic screening.  If you are at high risk for breast cancer, talk to your health  care provider about having an MRI and a mammogram every year.  Breast cancer gene (BRCA) assessment is recommended for women who have family members with BRCA-related cancers. BRCA-related cancers include: ? Breast. ? Ovarian. ? Tubal. ? Peritoneal cancers.  Results of the assessment will determine the need for genetic counseling and BRCA1 and BRCA2 testing.  Cervical Cancer Your health care provider may recommend that you be screened regularly for cancer of the pelvic organs (ovaries, uterus, and vagina). This screening involves a pelvic examination, including checking for microscopic changes to the surface of your cervix (Pap test). You may be encouraged to have this screening done every 3 years, beginning at age 37.  For women ages 72-65, health care providers may recommend pelvic exams and Pap testing every 3 years, or they may recommend the Pap and pelvic exam, combined with testing for human papilloma virus (HPV), every 5 years. Some types of HPV increase your risk of cervical cancer. Testing for HPV may also be done on women of any age with unclear Pap test results.  Other health care providers may not recommend any screening for nonpregnant women who are considered low risk for pelvic cancer and who do not have symptoms. Ask your health care provider if a screening pelvic exam is right for you.  If you have had past treatment for cervical cancer or a condition that could lead to cancer, you need Pap tests and screening for cancer for at least 20 years after your treatment. If Pap tests have been discontinued, your risk factors (such as having a new sexual partner) need to be reassessed to determine if screening should resume. Some women have medical problems that increase the chance of getting cervical cancer. In these cases, your health care provider may recommend more frequent screening and Pap tests.  Colorectal Cancer  This type of cancer can be detected and often  prevented.  Routine colorectal cancer screening usually begins at 51 years of age and continues through 51 years of age.  Your health care provider may recommend screening at an earlier age if you have risk factors for colon cancer.  Your health care provider may also recommend using home test kits to check for hidden blood in the stool.  A small camera at the end of a tube can be used to examine your colon directly (sigmoidoscopy or colonoscopy). This is done to check for the earliest forms of colorectal cancer.  Routine screening usually begins at age 18.  Direct examination of the colon should be repeated every 5-10 years through 51 years of age. However, you may need to be screened more often if early forms of precancerous polyps or small growths are found.  Skin Cancer  Check your skin from head to toe regularly.  Tell your health care provider about any new moles or changes in moles, especially if there is a  change in a mole's shape or color.  Also tell your health care provider if you have a mole that is larger than the size of a pencil eraser.  Always use sunscreen. Apply sunscreen liberally and repeatedly throughout the day.  Protect yourself by wearing long sleeves, pants, a wide-brimmed hat, and sunglasses whenever you are outside.  Heart disease, diabetes, and high blood pressure  High blood pressure causes heart disease and increases the risk of stroke. High blood pressure is more likely to develop in: ? People who have blood pressure in the high end of the normal range (130-139/85-89 mm Hg). ? People who are overweight or obese. ? People who are African American.  If you are 79-13 years of age, have your blood pressure checked every 3-5 years. If you are 37 years of age or older, have your blood pressure checked every year. You should have your blood pressure measured twice-once when you are at a hospital or clinic, and once when you are not at a hospital or clinic.  Record the average of the two measurements. To check your blood pressure when you are not at a hospital or clinic, you can use: ? An automated blood pressure machine at a pharmacy. ? A home blood pressure monitor.  If you are between 71 years and 51 years old, ask your health care provider if you should take aspirin to prevent strokes.  Have regular diabetes screenings. This involves taking a blood sample to check your fasting blood sugar level. ? If you are at a normal weight and have a low risk for diabetes, have this test once every three years after 51 years of age. ? If you are overweight and have a high risk for diabetes, consider being tested at a younger age or more often. Preventing infection Hepatitis B  If you have a higher risk for hepatitis B, you should be screened for this virus. You are considered at high risk for hepatitis B if: ? You were born in a country where hepatitis B is common. Ask your health care provider which countries are considered high risk. ? Your parents were born in a high-risk country, and you have not been immunized against hepatitis B (hepatitis B vaccine). ? You have HIV or AIDS. ? You use needles to inject street drugs. ? You live with someone who has hepatitis B. ? You have had sex with someone who has hepatitis B. ? You get hemodialysis treatment. ? You take certain medicines for conditions, including cancer, organ transplantation, and autoimmune conditions.  Hepatitis C  Blood testing is recommended for: ? Everyone born from [birth year] through 1964. ? Anyone with known risk factors for hepatitis C.  Sexually transmitted infections (STIs)  You should be screened for sexually transmitted infections (STIs) including gonorrhea and chlamydia if: ? You are sexually active and are younger than 51 years of age. ? You are older than 51 years of age and your health care provider tells you that you are at risk for this type of infection. ? Your sexual  activity has changed since you were last screened and you are at an increased risk for chlamydia or gonorrhea. Ask your health care provider if you are at risk.  If you do not have HIV, but are at risk, it may be recommended that you take a prescription medicine daily to prevent HIV infection. This is called pre-exposure prophylaxis (PrEP). You are considered at risk if: ? You are sexually active and do not regularly  use condoms or know the HIV status of your partner(s). ? You take drugs by injection. ? You are sexually active with a partner who has HIV.  Talk with your health care provider about whether you are at high risk of being infected with HIV. If you choose to begin PrEP, you should first be tested for HIV. You should then be tested every 3 months for as long as you are taking PrEP. Pregnancy  If you are premenopausal and you may become pregnant, ask your health care provider about preconception counseling.  If you may become pregnant, take 400 to 800 micrograms (mcg) of folic acid every day.  If you want to prevent pregnancy, talk to your health care provider about birth control (contraception). Osteoporosis and menopause  Osteoporosis is a disease in which the bones lose minerals and strength with aging. This can result in serious bone fractures. Your risk for osteoporosis can be identified using a bone density scan.  If you are 4 years of age or older, or if you are at risk for osteoporosis and fractures, ask your health care provider if you should be screened.  Ask your health care provider whether you should take a calcium or vitamin D supplement to lower your risk for osteoporosis.  Menopause may have certain physical symptoms and risks.  Hormone replacement therapy may reduce some of these symptoms and risks. Talk to your health care provider about whether hormone replacement therapy is right for you. Follow these instructions at home:  Schedule regular health, dental,  and eye exams.  Stay current with your immunizations.  Do not use any tobacco products including cigarettes, chewing tobacco, or electronic cigarettes.  If you are pregnant, do not drink alcohol.  If you are breastfeeding, limit how much and how often you drink alcohol.  Limit alcohol intake to no more than 1 drink per day for nonpregnant women. One drink equals 12 ounces of beer, 5 ounces of wine, or 1 ounces of hard liquor.  Do not use street drugs.  Do not share needles.  Ask your health care provider for help if you need support or information about quitting drugs.  Tell your health care provider if you often feel depressed.  Tell your health care provider if you have ever been abused or do not feel safe at home. This information is not intended to replace advice given to you by your health care provider. Make sure you discuss any questions you have with your health care provider. Document Released: 02/01/2011 Document Revised: 12/25/2015 Document Reviewed: 04/22/2015 Elsevier Interactive Patient Education  [employer].

## 2018-05-03 LAB — HEMOGLOBIN A1C
Est. average glucose Bld gHb Est-mCnc: 94 mg/dL
Hgb A1c MFr Bld: 4.9 % (ref 4.8–5.6)

## 2018-05-03 LAB — TSH: TSH: 1.99 u[IU]/mL (ref 0.450–4.500)

## 2018-05-05 ENCOUNTER — Ambulatory Visit (INDEPENDENT_AMBULATORY_CARE_PROVIDER_SITE_OTHER): Payer: BLUE CROSS/BLUE SHIELD | Admitting: Obstetrics and Gynecology

## 2018-05-05 VITALS — BP 131/94 | HR 89 | Ht 66.0 in | Wt 247.0 lb

## 2018-05-05 DIAGNOSIS — R6882 Decreased libido: Secondary | ICD-10-CM | POA: Diagnosis not present

## 2018-05-05 MED ORDER — TESTOSTERONE CYPIONATE 200 MG/ML IM SOLN
200.0000 mg | INTRAMUSCULAR | Status: DC
  Administered 2018-05-05 – 2018-06-27 (×3): 200 mg via INTRAMUSCULAR

## 2018-05-05 NOTE — Progress Notes (Signed)
Pt is present today for her first testosterone injections.  BP (!) 131/94   Pulse 89   Ht 5\' 6"  (1.676 m)   Wt 247 lb (112 kg)   LMP 04/24/2018 (Exact Date)   BMI 39.87 kg/m

## 2018-05-10 ENCOUNTER — Encounter: Payer: BLUE CROSS/BLUE SHIELD | Admitting: Obstetrics and Gynecology

## 2018-05-25 ENCOUNTER — Encounter: Payer: BLUE CROSS/BLUE SHIELD | Admitting: Obstetrics and Gynecology

## 2018-06-02 ENCOUNTER — Ambulatory Visit (INDEPENDENT_AMBULATORY_CARE_PROVIDER_SITE_OTHER): Payer: BLUE CROSS/BLUE SHIELD | Admitting: Obstetrics and Gynecology

## 2018-06-02 VITALS — BP 115/77 | HR 81 | Ht 66.0 in | Wt 250.4 lb

## 2018-06-02 DIAGNOSIS — R6882 Decreased libido: Secondary | ICD-10-CM | POA: Diagnosis not present

## 2018-06-02 NOTE — Progress Notes (Addendum)
Pt is present today for testosterone injection. Pt's last injection was 05/05/18. Pt stated that she is having light side effects. Pt stated that she has some acne, but no other complaints.   BP 115/77   Pulse 81   Ht 5\' 6"  (1.676 m)   Wt 250 lb 6.4 oz (113.6 kg)   LMP 05/06/2018   BMI 40.42 kg/m

## 2018-06-13 ENCOUNTER — Telehealth: Payer: Self-pay | Admitting: Obstetrics and Gynecology

## 2018-06-13 NOTE — Telephone Encounter (Signed)
Patient called requesting a refill on testosterone sent to [facility] on [address] in [place].Thanks

## 2018-06-14 NOTE — Telephone Encounter (Signed)
N/a.  Will try later.

## 2018-06-16 ENCOUNTER — Telehealth: Payer: Self-pay

## 2018-06-16 ENCOUNTER — Other Ambulatory Visit: Payer: Self-pay | Admitting: Obstetrics and Gynecology

## 2018-06-16 MED ORDER — TESTOSTERONE CYPIONATE 200 MG/ML IM SOLN: 100.0000 mg | INTRAMUSCULAR | 0 refills | Status: DC

## 2018-06-16 NOTE — Telephone Encounter (Signed)
Called pharmacy to do a price check on testosterone cypionate without her insurance. Received price amounts and calling pt to inform her of the information.

## 2018-06-16 NOTE — Telephone Encounter (Signed)
Pt was informed about her medication and I apologized to the pt for accidentally throwing away her medication after her last visit. pt stated that she did not know that she had a multi-supply(use) vials. Spoke with pt and she is okay with paying for it out of pocket for the month of December. Pt is aware that after each use to make sure she get her vitals back from the nurse.

## 2018-06-16 NOTE — Telephone Encounter (Signed)
Pt states she does not have any med left. [facility] gave pt her last inj. She thought she left the vial there for her.   I spoke with [facility]- they gave her 28ml(2 58ml vials) pts insurance will not pay for another refill until 08/2018. [facility] will contact pt to discuss.  Pt may need to be SP for 3ml vial to get her thru until 08/2018.

## 2018-06-21 ENCOUNTER — Telehealth: Payer: Self-pay

## 2018-06-21 NOTE — Telephone Encounter (Signed)
Spoke with pharmacy concerning the pt's dose of testosterone injection. The medication was explained on how the medication is given and changes were made to the medication.

## 2018-06-27 ENCOUNTER — Ambulatory Visit (INDEPENDENT_AMBULATORY_CARE_PROVIDER_SITE_OTHER): Payer: BLUE CROSS/BLUE SHIELD | Admitting: Obstetrics and Gynecology

## 2018-06-27 VITALS — BP 150/96 | HR 86 | Ht 66.0 in | Wt 255.8 lb

## 2018-06-27 DIAGNOSIS — R6882 Decreased libido: Secondary | ICD-10-CM | POA: Diagnosis not present

## 2018-06-27 NOTE — Telephone Encounter (Signed)
g

## 2018-06-27 NOTE — Progress Notes (Signed)
Patient comes in today for her Testosterone injection. Injection given in right upper outer gluteal. Patient tolerated injection well. She will return in 28 days for next injection.

## 2018-06-28 ENCOUNTER — Inpatient Hospital Stay: Admission: RE | Admit: 2018-06-28 | Payer: BLUE CROSS/BLUE SHIELD | Source: Ambulatory Visit

## 2018-06-30 ENCOUNTER — Ambulatory Visit: Payer: BLUE CROSS/BLUE SHIELD

## 2018-07-03 ENCOUNTER — Other Ambulatory Visit: Payer: Self-pay | Admitting: Obstetrics and Gynecology

## 2018-07-03 DIAGNOSIS — Z1231 Encounter for screening mammogram for malignant neoplasm of breast: Secondary | ICD-10-CM

## 2018-07-06 ENCOUNTER — Ambulatory Visit
Admission: RE | Admit: 2018-07-06 | Discharge: 2018-07-06 | Disposition: A | Discharge status: Home / Self Care | Payer: BLUE CROSS/BLUE SHIELD | Source: Ambulatory Visit | Attending: Obstetrics and Gynecology | Admitting: Obstetrics and Gynecology

## 2018-07-06 DIAGNOSIS — Z1231 Encounter for screening mammogram for malignant neoplasm of breast: Secondary | ICD-10-CM | POA: Diagnosis not present

## 2018-07-25 ENCOUNTER — Ambulatory Visit (INDEPENDENT_AMBULATORY_CARE_PROVIDER_SITE_OTHER): Payer: Self-pay | Admitting: Obstetrics and Gynecology

## 2018-07-25 VITALS — BP 126/88 | HR 73 | Ht 66.0 in | Wt 255.6 lb

## 2018-07-25 DIAGNOSIS — R6882 Decreased libido: Secondary | ICD-10-CM

## 2018-07-25 MED ORDER — TESTOSTERONE CYPIONATE 200 MG/ML IM SOLN: 200.0000 mg | Freq: Once | INTRAMUSCULAR | Status: DC

## 2018-07-25 NOTE — Progress Notes (Signed)
Pt presents for a NV for a testosterone inj. Tolerated well. Will make followup appointment with Dr Marcelline Mates.

## 2018-07-28 ENCOUNTER — Encounter: Payer: Self-pay | Admitting: Obstetrics and Gynecology

## 2018-12-12 ENCOUNTER — Telehealth: Payer: Self-pay | Admitting: Obstetrics and Gynecology

## 2018-12-12 NOTE — Telephone Encounter (Signed)
Can you help with this?

## 2018-12-12 NOTE — Telephone Encounter (Signed)
The patient called and stated that she needs to come in and receive a testosterone injection. I reviewed the patients chart and noticed the patient has not had an injection since December 2019. I informed the patient that a nurse will review this message and reach out to her with 24 hours. Please advise.

## 2018-12-13 NOTE — Telephone Encounter (Signed)
We can refill her medication (the dose that Dr. Esmeralda Links was giving her, I do believe it was 200 mg of the testosterone cypionate with 2 refills).  She will also need an appointment with me.  We can probably get her in for both on Friday.   Dr. Marcelline Mates

## 2018-12-14 NOTE — Telephone Encounter (Signed)
LM for patient to return call.

## 2018-12-15 MED ORDER — TESTOSTERONE CYPIONATE 200 MG/ML IM SOLN: 100.0000 mg | INTRAMUSCULAR | 0 refills | Status: DC

## 2018-12-15 NOTE — Telephone Encounter (Signed)
Spoke with pt and informed her depotestosterone had been sent to her pharmacy and that she needed a visit to she a provider. Pt made an appointment to [facility] next week for her injection and f/u visit with her.

## 2018-12-18 ENCOUNTER — Encounter: Payer: Self-pay | Admitting: Surgical

## 2018-12-19 ENCOUNTER — Ambulatory Visit: Payer: Self-pay | Admitting: Obstetrics and Gynecology

## 2018-12-19 ENCOUNTER — Other Ambulatory Visit: Payer: Self-pay

## 2018-12-19 ENCOUNTER — Encounter: Payer: Self-pay | Admitting: Obstetrics and Gynecology

## 2018-12-19 VITALS — BP 138/86 | HR 75 | Ht 66.0 in | Wt 256.2 lb

## 2018-12-19 DIAGNOSIS — R6882 Decreased libido: Secondary | ICD-10-CM

## 2018-12-19 DIAGNOSIS — I10 Essential (primary) hypertension: Secondary | ICD-10-CM

## 2018-12-19 MED ORDER — LOSARTAN POTASSIUM-HCTZ 100-12.5 MG PO TABS: 1.0000 | ORAL_TABLET | Freq: Every day | ORAL | 0 refills | Status: DC

## 2018-12-19 MED ORDER — TESTOSTERONE CYPIONATE 200 MG/ML IM SOLN
200.0000 mg | Freq: Once | INTRAMUSCULAR | Status: AC
  Administered 2018-12-19: 15:00:00 200 mg via INTRAMUSCULAR

## 2018-12-19 NOTE — Progress Notes (Signed)
    GYNECOLOGY PROGRESS NOTE  Subjective:    Patient ID: Lauren Collier, female    DOB: Oct 09, 1965, 53 y.o.   MRN: [id number]  HPI  Patient is a 53 y.o. G30P7007 female who presents for resuming her testosterone injections for decreased libido.  She was previously a patient of Dr. Hassell Done Defrancesco who has retired.  Her last injection was in December 2019.  She notes that she had been doing well, but began noting increased side effects of facial hair growth and so held off on further injections until now.  Notes that her last injection lasted her for "quite a while", however has noted a dramatic decrease in libido over the past several weeks.    Additionally, patient requests a refill on her blood pressure medication. She notes that she has 9 days left.  She reports that she is usually seen at [facility] (where she also works), but it is currently experiencing lay-offs and certain areas are closed due to COVID-19.  She states that she had a PCP at [facility], but has not been seen in at least 2 years as that doctor left and she never re-established with a new PCP.   The following portions of the patient's history were reviewed and updated as appropriate: allergies, current medications, past family history, past medical history, past social history, past surgical history and problem list.  Review of Systems Pertinent items noted in HPI and remainder of comprehensive ROS otherwise negative.   Objective:   Blood pressure 138/86, pulse 75, height 5\' 6"  (1.676 m), weight 256 lb 3.2 oz (116.2 kg), last menstrual period 12/14/2018. General appearance: alert and no distress Remainder of exam deferred.    Assessment:   Decreased libido Hypertension  Plan:   - Patient administered testosterone injection today.  Will f /u in 6 weeks for next injection.  Also discussed other alternatives for increasing libido including estrogen with progesterone supplementation (instead of local estrogen  supplementation) if no risk factors.  Patient states that she is perimenopausal.  Will check progesterone levels, had estrogen levels assessed last year.   - Will give patient 1 month supply of blood pressure medication until she is able to re-establish with a PCP.  - Patient to f/u for annual exam in August/September for annual exam.    Rubie Maid, MD [facility]

## 2018-12-19 NOTE — Progress Notes (Signed)
Pt is present today for testosterone injection and follow up visit with provider. Pt stated that she was doing well no problems.

## 2018-12-20 LAB — PROGESTERONE: Progesterone: 0.1 ng/mL

## 2019-01-29 ENCOUNTER — Telehealth: Payer: Self-pay

## 2019-01-29 NOTE — Telephone Encounter (Signed)
Pt prescreened no symptoms.   Coronavirus (COVID-19) Are you at risk?  Are you at risk for the Coronavirus (COVID-19)?  To be considered HIGH RISK for Coronavirus (COVID-19), you have to meet the following criteria:  . Traveled to [place], [place], [place], [place] or [place]; or in the [place] to [place], [place], [place], or [place]; and have fever, cough, and shortness of breath within the last 2 weeks of travel OR . Been in close contact with a person diagnosed with COVID-19 within the last 2 weeks and have fever, cough, and shortness of breath . IF YOU DO NOT MEET THESE CRITERIA, YOU ARE CONSIDERED LOW RISK FOR COVID-19.  What to do if you are HIGH RISK for COVID-19?  Marland Kitchen If you are having a medical emergency, call 911. . Seek medical care right away. Before you go to a doctor's office, urgent care or emergency department, call ahead and tell them about your recent travel, contact with someone diagnosed with COVID-19, and your symptoms. You should receive instructions from your physician's office regarding next steps of care.  . When you arrive at healthcare provider, tell the healthcare staff immediately you have returned from visiting [place], [place], [place], [place] or [place]; or traveled in the [place] to [place], [place], [place], or [place]; in the last two weeks or you have been in close contact with a person diagnosed with COVID-19 in the last 2 weeks.   . Tell the health care staff about your symptoms: fever, cough and shortness of breath. . After you have been seen by a medical provider, you will be either: o Tested for (COVID-19) and discharged home on quarantine except to seek medical care if symptoms worsen, and asked to  - Stay home and avoid contact with others until you get your results (4-5 days)  - Avoid travel on public transportation if possible (such as bus, train, or airplane) or o Sent to the Emergency Department by EMS for evaluation,  COVID-19 testing, and possible admission depending on your condition and test results.  What to do if you are LOW RISK for COVID-19?  Reduce your risk of any infection by using the same precautions used for avoiding the common cold or flu:  Marland Kitchen Wash your hands often with soap and warm water for at least 20 seconds.  If soap and water are not readily available, use an alcohol-based hand sanitizer with at least 60% alcohol.  . If coughing or sneezing, cover your mouth and nose by coughing or sneezing into the elbow areas of your shirt or coat, into a tissue or into your sleeve (not your hands). . Avoid shaking hands with others and consider head nods or verbal greetings only. . Avoid touching your eyes, nose, or mouth with unwashed hands.  . Avoid close contact with people who are sick. . Avoid places or events with large numbers of people in one location, like concerts or sporting events. . Carefully consider travel plans you have or are making. . If you are planning any travel outside or inside the [place], visit the CDC's Travelers' Health webpage for the latest health notices. . If you have some symptoms but not all symptoms, continue to monitor at home and seek medical attention if your symptoms worsen. . If you are having a medical emergency, call 911.   [place] / e-Visit: [email or web address]         [facility]: [facility]: [facility]: [phone]

## 2019-01-30 ENCOUNTER — Ambulatory Visit: Payer: Self-pay | Admitting: Obstetrics and Gynecology

## 2019-01-30 ENCOUNTER — Other Ambulatory Visit: Payer: Self-pay

## 2019-01-30 ENCOUNTER — Encounter: Payer: Self-pay | Admitting: Obstetrics and Gynecology

## 2019-01-30 VITALS — BP 118/76 | HR 80 | Ht 66.0 in | Wt 256.4 lb

## 2019-01-30 DIAGNOSIS — N951 Menopausal and female climacteric states: Secondary | ICD-10-CM

## 2019-01-30 DIAGNOSIS — R6882 Decreased libido: Secondary | ICD-10-CM

## 2019-01-30 MED ORDER — PROGESTERONE MICRONIZED 100 MG PO CAPS: 100.0000 mg | ORAL_CAPSULE | Freq: Every day | ORAL | 3 refills | Status: DC

## 2019-01-30 MED ORDER — ESTRADIOL 1 MG PO TABS: 1.0000 mg | ORAL_TABLET | Freq: Every day | ORAL | 11 refills | Status: DC

## 2019-01-30 NOTE — Progress Notes (Signed)
    GYNECOLOGY PROGRESS NOTE  Subjective:    Patient ID: Lauren Collier, female    DOB: May 14, 1966, 53 y.o.   MRN: [id number]  HPI  Patient is a 53 y.o. G71P7007 female who presents for further discussion of decreased libido.  Has been using testosterone injections, just resumed last month after a 5 month break.  Notes that most recent injection did not feel like it helped as much and is tired of the excessive hair growth.  Last visit discussed use of estrogen/progesterone to help with symptoms.  Patient willing to try today.   Of note, patient states that her husband has expressed interest in them having another child.  Notes that she has seen an REI specialist in [place] who notes that it may be possible with assistance, but would be concerned about egg quality.  She notes she has discussed with her daughters who would be willing to donate.   The following portions of the patient's history were reviewed and updated as appropriate: allergies, current medications, past family history, past medical history, past social history, past surgical history and problem list.  Review of Systems Pertinent items noted in HPI and remainder of comprehensive ROS otherwise negative.   Objective:   Blood pressure 118/76, pulse 80, height 5\' 6"  (1.676 m), weight 256 lb 6.4 oz (116.3 kg), last menstrual period 01/15/2019. General appearance: alert and no distress Remainder of exam deferred.    Assessment:   Decreased libido Perimenopause Plan:   - Will try patient on estradiol/prometrium for management of libido and perimenopause. Also given information on Vyleesi.  - Patient notes she will f/u with fertility specialist as desired.  - To f/u in 2 months to reassess symptoms.    Rubie Maid, MD [facility]

## 2019-01-30 NOTE — Progress Notes (Signed)
Pt is present today for a follow up visit with provider for decreased libido.

## 2019-02-05 NOTE — Telephone Encounter (Signed)
I'm sure if she notifies her PCP that she will be out of her BP meds prior to her appointment that they will fill it for her.  If she has an issues getting in touch with them, she can let us know and we can give her a 30 day supply.

## 2019-02-08 ENCOUNTER — Other Ambulatory Visit: Payer: Self-pay | Admitting: Surgical

## 2019-02-08 MED ORDER — LOSARTAN POTASSIUM-HCTZ 100-12.5 MG PO TABS: 1.0000 | ORAL_TABLET | Freq: Every day | ORAL | 0 refills | Status: DC

## 2019-04-02 NOTE — Progress Notes (Deleted)
Patient present for 2 month follow up for decreased libido and testosterone injection.

## 2019-04-03 ENCOUNTER — Encounter: Payer: Self-pay | Admitting: Obstetrics and Gynecology

## 2019-04-10 ENCOUNTER — Other Ambulatory Visit: Payer: Self-pay | Admitting: Obstetrics and Gynecology

## 2019-04-10 ENCOUNTER — Telehealth: Payer: Self-pay

## 2019-04-10 MED ORDER — TESTOSTERONE CYPIONATE 200 MG/ML IM SOLN: 100.0000 mg | INTRAMUSCULAR | 0 refills | Status: DC

## 2019-04-10 NOTE — Telephone Encounter (Signed)
Pt is aware that Dr. Marcelline Mates refilled testosterone and was sent to [facility] on [address].

## 2019-04-11 ENCOUNTER — Telehealth: Payer: Self-pay | Admitting: Obstetrics and Gynecology

## 2019-04-11 ENCOUNTER — Other Ambulatory Visit: Payer: Self-pay | Admitting: Obstetrics and Gynecology

## 2019-04-11 ENCOUNTER — Encounter: Payer: Self-pay | Admitting: Obstetrics and Gynecology

## 2019-04-11 ENCOUNTER — Ambulatory Visit: Payer: Self-pay | Admitting: Obstetrics and Gynecology

## 2019-04-11 ENCOUNTER — Other Ambulatory Visit: Payer: Self-pay

## 2019-04-11 VITALS — BP 117/78 | HR 73 | Ht 66.0 in | Wt 251.7 lb

## 2019-04-11 DIAGNOSIS — R6882 Decreased libido: Secondary | ICD-10-CM

## 2019-04-11 MED ORDER — ESTRADIOL 1 MG PO TABS: 1.0000 mg | ORAL_TABLET | Freq: Every day | ORAL | 3 refills | Status: DC

## 2019-04-11 MED ORDER — TESTOSTERONE CYPIONATE 200 MG/ML IM SOLN: 100.0000 mg | INTRAMUSCULAR | 0 refills | Status: DC

## 2019-04-11 MED ORDER — TESTOSTERONE CYPIONATE 200 MG/ML IM SOLN
200.0000 mg | INTRAMUSCULAR | Status: DC
  Administered 2019-04-11: 200 mg via INTRAMUSCULAR

## 2019-04-11 NOTE — Telephone Encounter (Signed)
Issues has been resolved and pt had dose of medication this evening.

## 2019-04-11 NOTE — Telephone Encounter (Signed)
Pt is aware that [facility] will be [facility] [place] after lunch. Pt stated that she will be at work and would like [facility] [place] on [address]

## 2019-04-11 NOTE — Telephone Encounter (Signed)
The patient's pharmacy called and stated that they are not able to transfer the prescription she has to another pharmacy due to the prescription being a controlled substance and also with it being a new prescription that has not been filled at there facility. Pt's pharmacy stated that the patient is requesting that [facility] [address] [facility] [place]. Please advise.

## 2019-04-11 NOTE — Progress Notes (Signed)
    GYNECOLOGY PROGRESS NOTE  Subjective:    Patient ID: Lauren Collier, female    DOB: 25-May-1966, 54 y.o.   MRN: [id number]  HPI  Patient is a 53 y.o. G68P7007 female who presents for 2 month follow up of decreased libido.  Discontinued testosterone injections at that time due to side effects of facial hair growth.  Had progesterone added to her estradiol therapy that she was taking for peri-menopausal symptoms for endometrial protection. Notes that she did not note any difference in her libido over the past 2 months.  Would like to resume her testosterone injections and will use depilatory creams if needed for her facial hair.  Attempted to receive prescription for [facility], however was denied due due to pre-existing condition of HTN (although well controlled).   Of note, she does note that changing the scenery did help her libido some.  Notes that she has 14 grandchildren, and often has a house full with daily visitors. She and her partner have found it hard in the past to have some "alone time" but plans to make this a more scheduled thing.    The following portions of the patient's history were reviewed and updated as appropriate: allergies, current medications, past family history, past medical history, past social history, past surgical history and problem list.  Review of Systems Pertinent items noted in HPI and remainder of comprehensive ROS otherwise negative.   Objective:   Blood pressure 117/78, pulse 73, height 5\' 6"  (1.676 m), weight 251 lb 11.2 oz (114.2 kg). General appearance: alert and no distress Exam deferred.    Assessment:   Decreased libido  Plan:   - Patient desires to resume testosterone injections.  First of 3 injections given today. Patient will use depilatory creams if needed for her facial hair. Also discussed options of waxing, uses of spironolactone or Vaniqua cream. Continue to encourage plans for making specialized time with her husband.  - RTC in 1  month for next injection, 1-2 months for annual exam.    Rubie Maid, MD [facility]

## 2019-04-11 NOTE — Telephone Encounter (Signed)
The patient called and stated that she needs her injection sent to a different pharmacy. The testosterone cypionate (DEPOTESTOSTERONE CYPIONATE) 200 MG/ML injection [7784] [facility] [address] [facility] [place]. The patient stated that she needs this sent in as soon as possible so she is able to bring the medication to her appointment today. Please advise.

## 2019-04-11 NOTE — Progress Notes (Signed)
Pt present for follow up on medication and testosterone injection. Pt was given 0.2 ml of testosterone. Pt stated that she was doing well.

## 2019-04-11 NOTE — Patient Instructions (Signed)
Testosterone injection What is this medicine? TESTOSTERONE (tes TOS ter one) is the main female hormone. It supports normal female development such as muscle growth, facial hair, and deep voice. It is used in males to treat low testosterone levels. This medicine may be used for other purposes; ask your health care provider or pharmacist if you have questions. COMMON BRAND NAME(S): Andro-L.A., Aveed, Delatestryl, Depo-Testosterone, Virilon What should I tell my health care provider before I take this medicine? They need to know if you have any of these conditions:  cancer  diabetes  heart disease  kidney disease  liver disease  lung disease  prostate disease  an unusual or allergic reaction to testosterone, other medicines, foods, dyes, or preservatives  pregnant or trying to get pregnant  breast-feeding How should I use this medicine? This medicine is for injection into a muscle. It is usually given by a health care professional in a hospital or clinic setting. Contact your pediatrician regarding the use of this medicine in children. While this medicine may be prescribed for children as young as 54 years of age for selected conditions, precautions do apply. Overdosage: If you think you have taken too much of this medicine contact a poison control center or emergency room at once. NOTE: This medicine is only for you. Do not share this medicine with others. What if I miss a dose? Try not to miss a dose. Your doctor or health care professional will tell you when your next injection is due. Notify the office if you are unable to keep an appointment. What may interact with this medicine?  medicines for diabetes  medicines that treat or prevent blood clots like warfarin  oxyphenbutazone  propranolol  steroid medicines like prednisone or cortisone This list may not describe all possible interactions. Give your health care provider a list of all the medicines, herbs, non-prescription  drugs, or dietary supplements you use. Also tell them if you smoke, drink alcohol, or use illegal drugs. Some items may interact with your medicine. What should I watch for while using this medicine? Visit your doctor or health care professional for regular checks on your progress. They will need to check the level of testosterone in your blood. This medicine is only approved for use in men who have low levels of testosterone related to certain medical conditions. Heart attacks and strokes have been reported with the use of this medicine. Notify your doctor or health care professional and seek emergency treatment if you develop breathing problems; changes in vision; confusion; chest pain or chest tightness; sudden arm pain; severe, sudden headache; trouble speaking or understanding; sudden numbness or weakness of the face, arm or leg; loss of balance or coordination. Talk to your doctor about the risks and benefits of this medicine. This medicine may affect blood sugar levels. If you have diabetes, check with your doctor or health care professional before you change your diet or the dose of your diabetic medicine. Testosterone injections are not commonly used in women. Women should inform their doctor if they wish to become pregnant or think they might be pregnant. There is a potential for serious side effects to an unborn child. Talk to your health care professional or pharmacist for more information. Talk with your doctor or health care professional about your birth control options while taking this medicine. This drug is banned from use in athletes by most athletic organizations. What side effects may I notice from receiving this medicine? Side effects that you should report to  your doctor or health care professional as soon as possible:  allergic reactions like skin rash, itching or hives, swelling of the face, lips, or tongue  breast enlargement  breathing problems  changes in emotions or  moods  deep or hoarse voice  irregular menstrual periods  signs and symptoms of liver injury like dark yellow or brown urine; general ill feeling or flu-like symptoms; light-colored stools; loss of appetite; nausea; right upper belly pain; unusually weak or tired; yellowing of the eyes or skin  stomach pain  swelling of the ankles, feet, hands  too frequent or persistent erections  trouble passing urine or change in the amount of urine Side effects that usually do not require medical attention (report to your doctor or health care professional if they continue or are bothersome):  acne  change in sex drive or performance  facial hair growth  hair loss  headache This list may not describe all possible side effects. Call your doctor for medical advice about side effects. You may report side effects to FDA at 1-800-FDA-1088. Where should I keep my medicine? Keep out of the reach of children. This medicine can be abused. Keep your medicine in a safe place to protect it from theft. Do not share this medicine with anyone. Selling or giving away this medicine is dangerous and against the law. Store at room temperature between 20 and 25 degrees C (68 and 77 degrees F). Do not freeze. Protect from light. Follow the directions for the product you are prescribed. Throw away any unused medicine after the expiration date. NOTE: This sheet is a summary. It may not cover all possible information. If you have questions about this medicine, talk to your doctor, pharmacist, or health care provider.  2020 Elsevier/Gold Standard (2015-08-23 07:33:55)  

## 2019-05-03 HISTORY — PX: COMBINED ABDOMINOPLASTY AND LIPOSUCTION: SUR284

## 2019-05-04 ENCOUNTER — Encounter: Payer: BLUE CROSS/BLUE SHIELD | Admitting: Obstetrics and Gynecology

## 2019-05-11 ENCOUNTER — Other Ambulatory Visit: Payer: Self-pay

## 2019-05-11 ENCOUNTER — Ambulatory Visit (INDEPENDENT_AMBULATORY_CARE_PROVIDER_SITE_OTHER): Payer: Self-pay | Admitting: Obstetrics and Gynecology

## 2019-05-11 VITALS — BP 116/78 | HR 82 | Wt 245.3 lb

## 2019-05-11 DIAGNOSIS — R6882 Decreased libido: Secondary | ICD-10-CM

## 2019-05-11 MED ORDER — TESTOSTERONE CYPIONATE 200 MG/ML IM SOLN
100.0000 mg | INTRAMUSCULAR | Status: DC
  Administered 2019-05-11: 15:00:00 100 mg via INTRAMUSCULAR

## 2019-05-11 NOTE — Progress Notes (Signed)
Patient comes in today for testosterone injection. 0.5 ml injected into the left upper outer glutea. Patient tolerated well. Patient stated that the medication is working well.   Vitals:   05/11/19 1431  BP: 116/78  Pulse: 82

## 2019-05-12 ENCOUNTER — Encounter: Payer: Self-pay | Admitting: Obstetrics and Gynecology

## 2019-06-13 ENCOUNTER — Encounter: Payer: Self-pay | Admitting: Obstetrics and Gynecology

## 2019-06-17 ENCOUNTER — Other Ambulatory Visit: Payer: Self-pay | Admitting: Obstetrics and Gynecology

## 2019-06-18 NOTE — Telephone Encounter (Signed)
It looks like the prescription was cancelled by Roselyn Reef last month. Also, patient missed her appointment last week. Let's contact and see what's going on.   Dr Marcelline Mates

## 2019-06-22 ENCOUNTER — Telehealth: Payer: Self-pay

## 2019-06-22 NOTE — Telephone Encounter (Signed)
Pt states had tummy tuck surgery 05/31/19 and cannot lie on her back and limited mobility and restricting aftercare so she missed her 06/13/19 physical appt and for testosterone injection. Please advise if pt can have injection only while recovering then do physical in mid December or whenever provider desires.

## 2019-06-26 NOTE — Telephone Encounter (Signed)
Pt states had tummy tuck surgery 05/31/19 and cannot lie on her back and limited mobility and restricting aftercare so she missed her 06/13/19 physical appt and for testosterone injection. Please advise if pt can have injection only while recovering then do physical in mid December or whenever provider desires.

## 2019-06-26 NOTE — Telephone Encounter (Signed)
Pt called back checking status of coming in for injection. Informed pt Dr. Marcelline Mates is out of the office and will let her know next week once she returns to the office.

## 2019-07-04 NOTE — Telephone Encounter (Signed)
Please see my chart messages

## 2019-07-05 ENCOUNTER — Ambulatory Visit (INDEPENDENT_AMBULATORY_CARE_PROVIDER_SITE_OTHER): Payer: Self-pay | Admitting: Obstetrics and Gynecology

## 2019-07-05 ENCOUNTER — Other Ambulatory Visit: Payer: Self-pay

## 2019-07-05 VITALS — BP 146/102 | HR 85 | Ht 66.0 in | Wt 227.9 lb

## 2019-07-05 DIAGNOSIS — R6882 Decreased libido: Secondary | ICD-10-CM

## 2019-07-05 MED ORDER — TESTOSTERONE CYPIONATE 200 MG/ML IM SOLN
200.0000 mg | INTRAMUSCULAR | Status: DC
  Administered 2019-07-05: 09:00:00 100 mg via INTRAMUSCULAR

## 2019-07-05 NOTE — Progress Notes (Signed)
Patient presents today for testosterone injection.  No complaints.  Patient states she forgot to take blood pressure medication today but will take when she gets home, denies HA's, swelling or visual changes.    BP (!) 146/102   Pulse 85   Ht 5\' 6"  (1.676 m)   Wt 227 lb 14.4 oz (103.4 kg)   LMP 05/17/2019 (Exact Date)   BMI 36.78 kg/m

## 2019-08-02 ENCOUNTER — Other Ambulatory Visit: Payer: Self-pay

## 2019-08-02 ENCOUNTER — Ambulatory Visit (INDEPENDENT_AMBULATORY_CARE_PROVIDER_SITE_OTHER): Payer: Self-pay | Admitting: Obstetrics and Gynecology

## 2019-08-02 VITALS — BP 150/94 | HR 93 | Wt 228.1 lb

## 2019-08-02 DIAGNOSIS — R6882 Decreased libido: Secondary | ICD-10-CM

## 2019-08-02 MED ORDER — TESTOSTERONE CYPIONATE 200 MG/ML IM SOLN
200.0000 mg | INTRAMUSCULAR | Status: DC
  Administered 2019-08-02: 15:00:00 200 mg via INTRAMUSCULAR

## 2019-08-02 NOTE — Progress Notes (Signed)
Pt is present for testosterone injection. Pt stated that she was doing well no problems.   BP (!) 150/94   Pulse 93   Wt 228 lb 1.6 oz (103.5 kg)   BMI 36.82 kg/m

## 2019-08-27 ENCOUNTER — Other Ambulatory Visit: Payer: Self-pay

## 2019-08-28 ENCOUNTER — Other Ambulatory Visit: Payer: Self-pay

## 2019-08-31 ENCOUNTER — Other Ambulatory Visit: Payer: Self-pay

## 2019-08-31 ENCOUNTER — Ambulatory Visit (INDEPENDENT_AMBULATORY_CARE_PROVIDER_SITE_OTHER): Payer: Self-pay | Admitting: Obstetrics and Gynecology

## 2019-08-31 VITALS — BP 143/96 | HR 73 | Ht 66.0 in | Wt 220.8 lb

## 2019-08-31 DIAGNOSIS — R6882 Decreased libido: Secondary | ICD-10-CM

## 2019-08-31 MED ORDER — TESTOSTERONE CYPIONATE 200 MG/ML IM SOLN
200.0000 mg | INTRAMUSCULAR | Status: DC
  Administered 2019-08-31: 09:00:00 75 mg via INTRAMUSCULAR

## 2019-08-31 NOTE — Progress Notes (Signed)
Patient here for testosterone injection.  Patient request dosage increase.  Per Dr. Andreas Blower VO, ok to increase to .21mls and patient agreed to proceed with plan.     BP (!) 143/96   Pulse 73   Ht 5\' 6"  (1.676 m)   Wt 220 lb 12.8 oz (100.2 kg)   LMP 06/12/2019 (Approximate)   BMI 35.64 kg/m   Patient forgot to take BP medication yesterday and today.

## 2019-09-05 IMAGING — MG DIGITAL SCREENING BILATERAL MAMMOGRAM WITH TOMO AND CAD
6 of 10 series · 6 of 30 positions shown · non-contrast
Comparison: Previous exam(s).

CLINICAL DATA: Screening.

EXAM:
DIGITAL SCREENING BILATERAL MAMMOGRAM WITH TOMO AND CAD

[R MLO synth-2D]
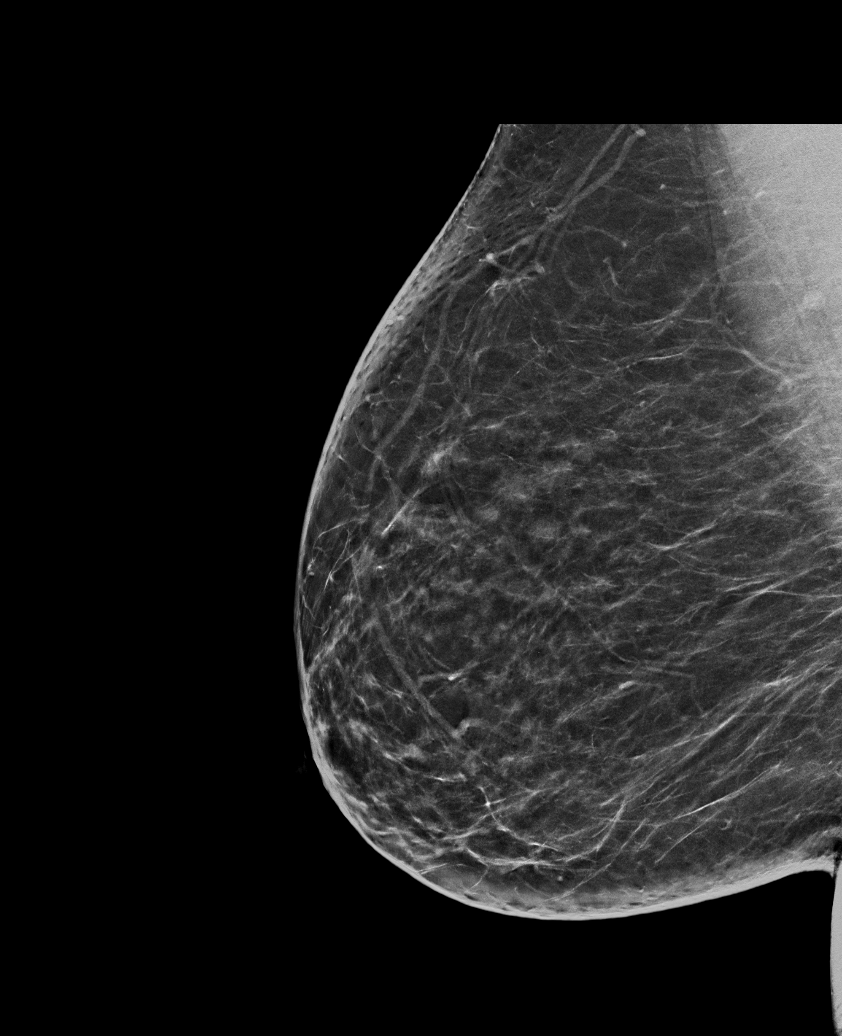

[R CC synth-2D]
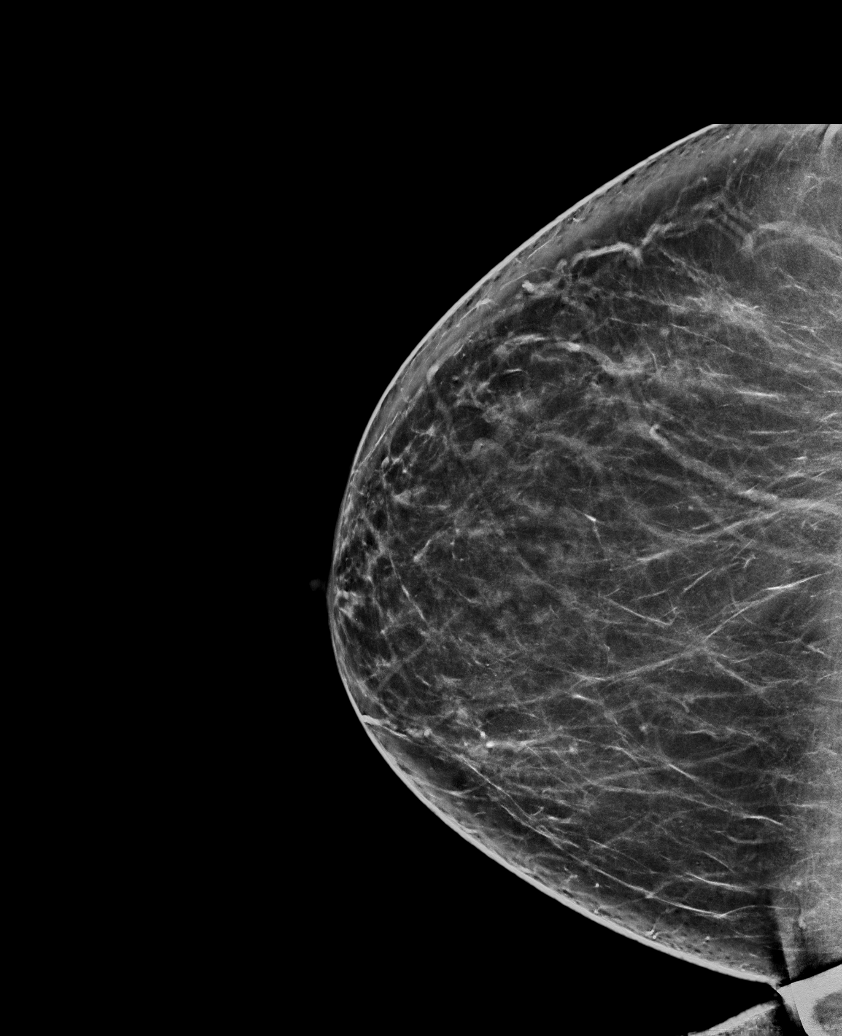

[L MLO synth-2D]
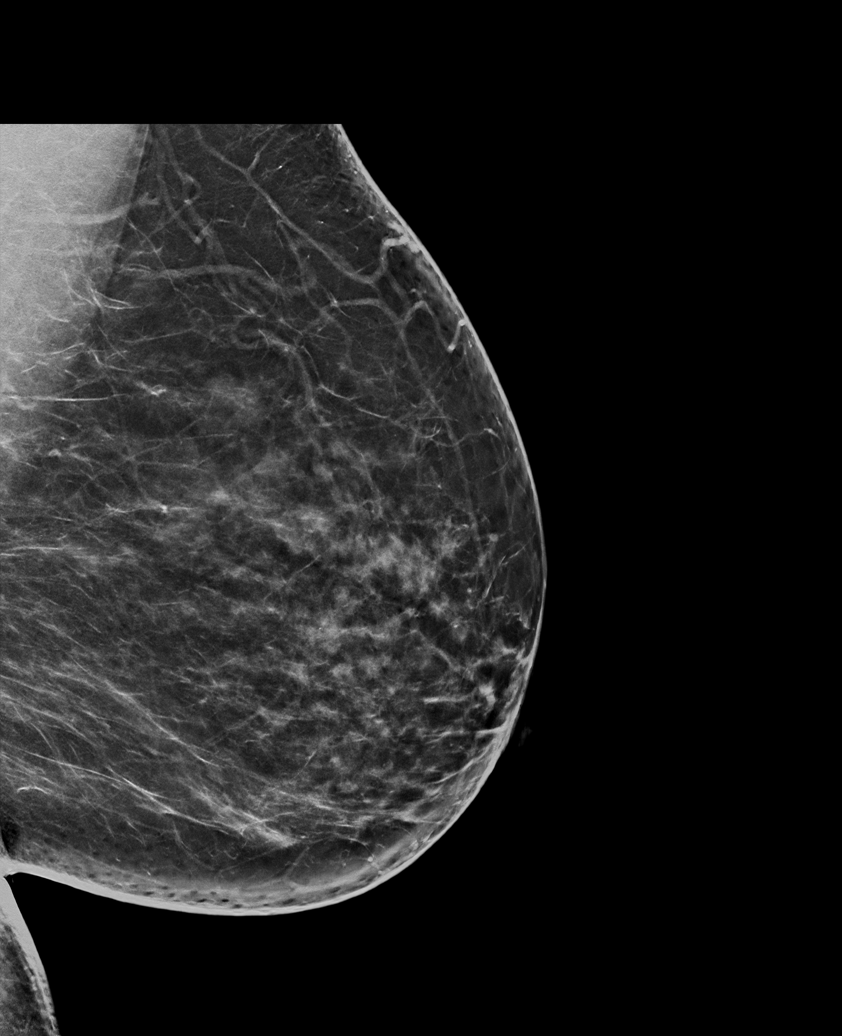

[L XCCL synth-2D]
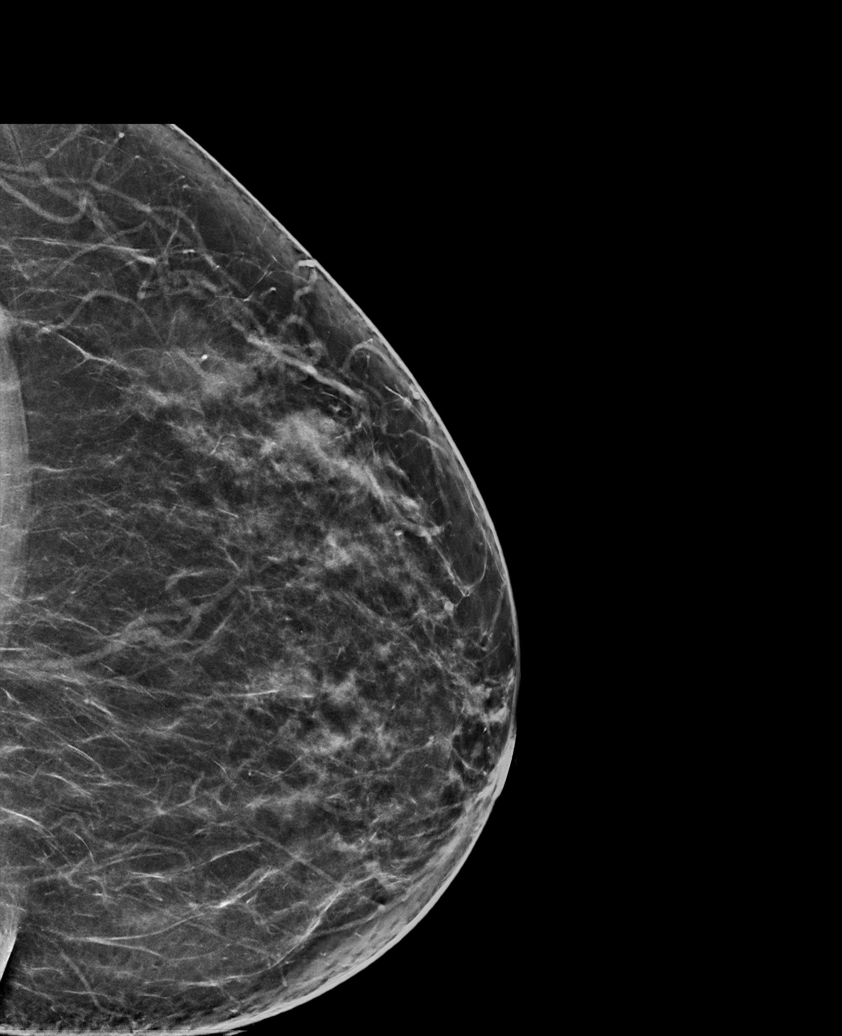

[L CC synth-2D]
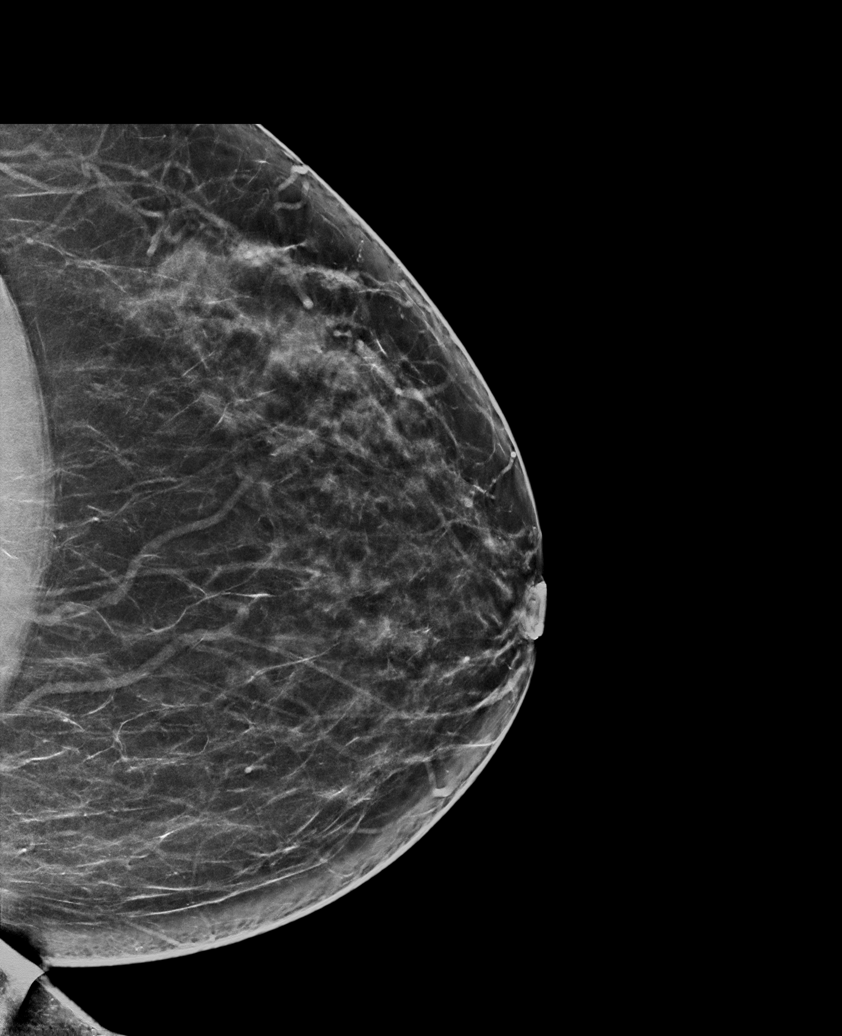

[R CC tomo · tomo slice 43/84.0]
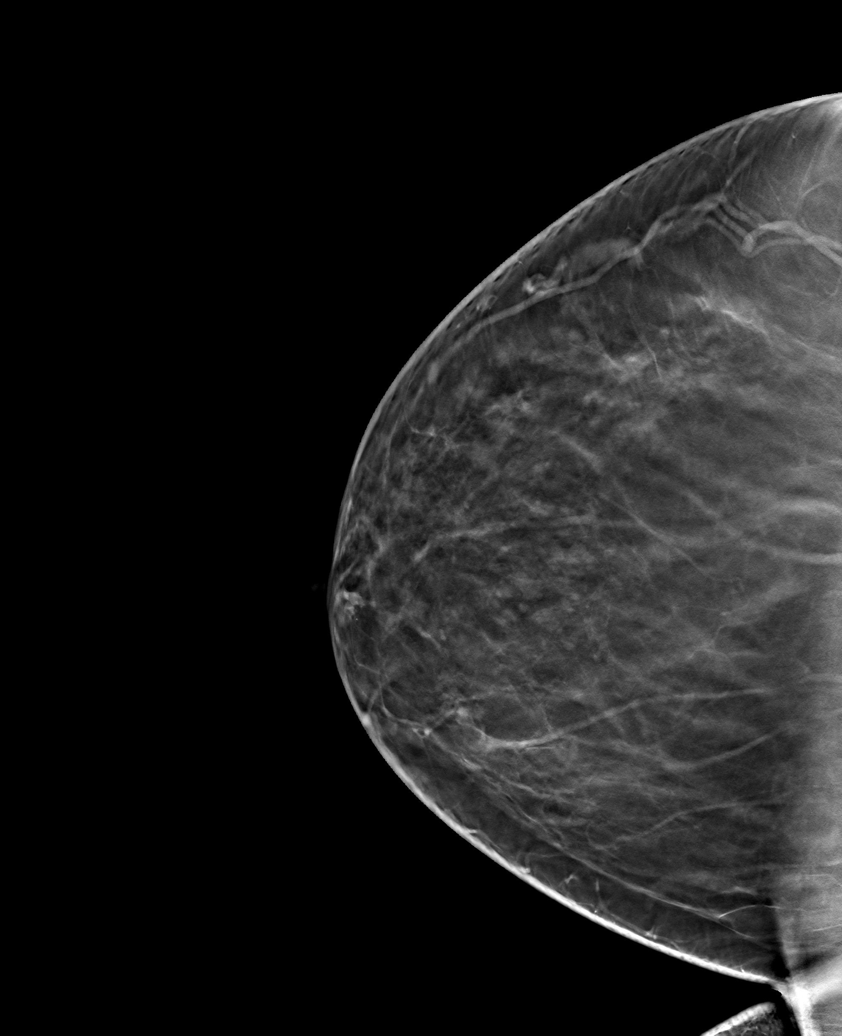

[6 of 30 positions shown; findings below may reference images not displayed]

ACR Breast Density Category b: There are scattered areas of
fibroglandular density.
FINDINGS: There are no findings suspicious for malignancy. Images were
processed with CAD.
IMPRESSION: No mammographic evidence of malignancy. A result letter of this
screening mammogram will be mailed directly to the patient.

RECOMMENDATION:
Screening mammogram in one year. (Code:[id number])

BI-RADS CATEGORY  1: Negative.

## 2019-09-12 ENCOUNTER — Encounter: Payer: Medicaid Other | Admitting: Obstetrics and Gynecology

## 2019-09-14 ENCOUNTER — Encounter: Payer: Self-pay | Admitting: Obstetrics and Gynecology

## 2019-09-14 ENCOUNTER — Other Ambulatory Visit (HOSPITAL_COMMUNITY)
Admission: RE | Admit: 2019-09-14 | Discharge: 2019-09-14 | Disposition: A | Discharge status: Home / Self Care | Payer: Self-pay | Source: Ambulatory Visit | Attending: Obstetrics and Gynecology | Admitting: Obstetrics and Gynecology

## 2019-09-14 ENCOUNTER — Ambulatory Visit (INDEPENDENT_AMBULATORY_CARE_PROVIDER_SITE_OTHER): Payer: Medicaid Other | Admitting: Obstetrics and Gynecology

## 2019-09-14 ENCOUNTER — Other Ambulatory Visit: Payer: Self-pay

## 2019-09-14 VITALS — BP 127/83 | HR 85 | Ht 66.0 in | Wt 225.2 lb

## 2019-09-14 DIAGNOSIS — I10 Essential (primary) hypertension: Secondary | ICD-10-CM

## 2019-09-14 DIAGNOSIS — Z0000 Encounter for general adult medical examination without abnormal findings: Secondary | ICD-10-CM | POA: Diagnosis not present

## 2019-09-14 DIAGNOSIS — E669 Obesity, unspecified: Secondary | ICD-10-CM

## 2019-09-14 DIAGNOSIS — Z01419 Encounter for gynecological examination (general) (routine) without abnormal findings: Secondary | ICD-10-CM | POA: Diagnosis not present

## 2019-09-14 DIAGNOSIS — N951 Menopausal and female climacteric states: Secondary | ICD-10-CM

## 2019-09-14 DIAGNOSIS — Z1231 Encounter for screening mammogram for malignant neoplasm of breast: Secondary | ICD-10-CM

## 2019-09-14 DIAGNOSIS — R6882 Decreased libido: Secondary | ICD-10-CM

## 2019-09-14 NOTE — Patient Instructions (Signed)
Preventive Care 40-53 Years Old, Female Preventive care refers to visits with your health care provider and lifestyle choices that can promote health and wellness. This includes:  A yearly physical exam. This may also be called an annual well check.  Regular dental visits and eye exams.  Immunizations.  Screening for certain conditions.  Healthy lifestyle choices, such as eating a healthy diet, getting regular exercise, not using drugs or products that contain nicotine and tobacco, and limiting alcohol use. What can I expect for my preventive care visit? Physical exam Your health care provider will check your:  Height and weight. This may be used to calculate body mass index (BMI), which tells if you are at a healthy weight.  Heart rate and blood pressure.  Skin for abnormal spots. Counseling Your health care provider may ask you questions about your:  Alcohol, tobacco, and drug use.  Emotional well-being.  Home and relationship well-being.  Sexual activity.  Eating habits.  Work and work environment.  Method of birth control.  Menstrual cycle.  Pregnancy history. What immunizations do I need?  Influenza (flu) vaccine  This is recommended every year. Tetanus, diphtheria, and pertussis (Tdap) vaccine  You may need a Td booster every 10 years. Varicella (chickenpox) vaccine  You may need this if you have not been vaccinated. Zoster (shingles) vaccine  You may need this after age 60. Measles, mumps, and rubella (MMR) vaccine  You may need at least one dose of MMR if you were born in 1957 or later. You may also need a second dose. Pneumococcal conjugate (PCV13) vaccine  You may need this if you have certain conditions and were not previously vaccinated. Pneumococcal polysaccharide (PPSV23) vaccine  You may need one or two doses if you smoke cigarettes or if you have certain conditions. Meningococcal conjugate (MenACWY) vaccine  You may need this if you  have certain conditions. Hepatitis A vaccine  You may need this if you have certain conditions or if you travel or work in places where you may be exposed to hepatitis A. Hepatitis B vaccine  You may need this if you have certain conditions or if you travel or work in places where you may be exposed to hepatitis B. Haemophilus influenzae type b (Hib) vaccine  You may need this if you have certain conditions. Human papillomavirus (HPV) vaccine  If recommended by your health care provider, you may need three doses over 6 months. You may receive vaccines as individual doses or as more than one vaccine together in one shot (combination vaccines). Talk with your health care provider about the risks and benefits of combination vaccines. What tests do I need? Blood tests  Lipid and cholesterol levels. These may be checked every 5 years, or more frequently if you are over 50 years old.  Hepatitis C test.  Hepatitis B test. Screening  Lung cancer screening. You may have this screening every year starting at age 55 if you have a 30-pack-year history of smoking and currently smoke or have quit within the past 15 years.  Colorectal cancer screening. All adults should have this screening starting at age 50 and continuing until age 75. Your health care provider may recommend screening at age 45 if you are at increased risk. You will have tests every 1-10 years, depending on your results and the type of screening test.  Diabetes screening. This is done by checking your blood sugar (glucose) after you have not eaten for a while (fasting). You may have this   done every 1-3 years.  Mammogram. This may be done every 1-2 years. Talk with your health care provider about when you should start having regular mammograms. This may depend on whether you have a family history of breast cancer.  BRCA-related cancer screening. This may be done if you have a family history of breast, ovarian, tubal, or peritoneal  cancers.  Pelvic exam and Pap test. This may be done every 3 years starting at age 21. Starting at age 30, this may be done every 5 years if you have a Pap test in combination with an HPV test. Other tests  Sexually transmitted disease (STD) testing.  Bone density scan. This is done to screen for osteoporosis. You may have this scan if you are at high risk for osteoporosis. Follow these instructions at home: Eating and drinking  Eat a diet that includes fresh fruits and vegetables, whole grains, lean protein, and low-fat dairy.  Take vitamin and mineral supplements as recommended by your health care provider.  Do not drink alcohol if: ? Your health care provider tells you not to drink. ? You are pregnant, may be pregnant, or are planning to become pregnant.  If you drink alcohol: ? Limit how much you have to 0-1 drink a day. ? Be aware of how much alcohol is in your drink. In the [place] one drink equals one 12 oz bottle of beer (355 mL), one 5 oz glass of wine (148 mL), or one 1 oz glass of hard liquor (44 mL). Lifestyle  Take daily care of your teeth and gums.  Stay active. Exercise for at least 30 minutes on 5 or more days each week.  Do not use any products that contain nicotine or tobacco, such as cigarettes, e-cigarettes, and chewing tobacco. If you need help quitting, ask your health care provider.  If you are sexually active, practice safe sex. Use a condom or other form of birth control (contraception) in order to prevent pregnancy and STIs (sexually transmitted infections).  If told by your health care provider, take low-dose aspirin daily starting at age 50. What's next?  Visit your health care provider once a year for a well check visit.  Ask your health care provider how often you should have your eyes and teeth checked.  Stay up to date on all vaccines. This information is not intended to replace advice given to you by your health care provider. Make sure you  discuss any questions you have with your health care provider. Document Revised: 03/30/2018 Document Reviewed: 03/30/2018 Elsevier Patient Education  2020 Elsevier Inc.        Breast Self-Awareness Breast self-awareness means being familiar with how your breasts look and feel. It involves checking your breasts regularly and reporting any changes to your health care provider. Practicing breast self-awareness is important. Sometimes changes may not be harmful (are benign), but sometimes a change in your breasts can be a sign of a serious medical problem. It is important to learn how to do this procedure correctly so that you can catch problems early, when treatment is more likely to be successful. All women should practice breast self-awareness, including women who have had breast implants. What you need:  A mirror.  A well-lit room. How to do a breast self-exam A breast self-exam is one way to learn what is normal for your breasts and whether your breasts are changing. To do a breast self-exam: Look for changes  1. Remove all the clothing above your waist. 2.   Stand in front of a mirror in a room with good lighting. 3. Put your hands on your hips. 4. Push your hands firmly downward. 5. Compare your breasts in the mirror. Look for differences between them (asymmetry), such as: ? Differences in shape. ? Differences in size. ? Puckers, dips, and bumps in one breast and not the other. 6. Look at each breast for changes in the skin, such as: ? Redness. ? Scaly areas. 7. Look for changes in your nipples, such as: ? Discharge. ? Bleeding. ? Dimpling. ? Redness. ? A change in position. Feel for changes Carefully feel your breasts for lumps and changes. It is best to do this while lying on your back on the floor, and again while sitting or standing in the tub or shower with soapy water on your skin. Feel each breast in the following way: 1. Place the arm on the side of the breast you  are examining above your head. 2. Feel your breast with the other hand. 3. Start in the nipple area and make -inch (2 cm) overlapping circles to feel your breast. Use the pads of your three middle fingers to do this. Apply light pressure, then medium pressure, then firm pressure. The light pressure will allow you to feel the tissue closest to the skin. The medium pressure will allow you to feel the tissue that is a little deeper. The firm pressure will allow you to feel the tissue close to the ribs. 4. Continue the overlapping circles, moving downward over the breast until you feel your ribs below your breast. 5. Move one finger-width toward the center of the body. Continue to use the -inch (2 cm) overlapping circles to feel your breast as you move slowly up toward your collarbone. 6. Continue the up-and-down exam using all three pressures until you reach your armpit.  Write down what you find Writing down what you find can help you remember what to discuss with your health care provider. Write down:  What is normal for each breast.  Any changes that you find in each breast, including: ? The kind of changes you find. ? Any pain or tenderness. ? Size and location of any lumps.  Where you are in your menstrual cycle, if you are still menstruating. General tips and recommendations  Examine your breasts every month.  If you are breastfeeding, the best time to examine your breasts is after a feeding or after using a breast pump.  If you menstruate, the best time to examine your breasts is 5-7 days after your period. Breasts are generally lumpier during menstrual periods, and it may be more difficult to notice changes.  With time and practice, you will become more familiar with the variations in your breasts and more comfortable with the exam. Contact a health care provider if you:  See a change in the shape or size of your breasts or nipples.  See a change in the skin of your breast or  nipples, such as a reddened or scaly area.  Have unusual discharge from your nipples.  Find a lump or thick area that was not there before.  Have pain in your breasts.  Have any concerns related to your breast health. Summary  Breast self-awareness includes looking for physical changes in your breasts, as well as feeling for any changes within your breasts.  Breast self-awareness should be performed in front of a mirror in a well-lit room.  You should examine your breasts every month. If you menstruate,   the best time to examine your breasts is 5-7 days after your menstrual period.  Let your health care provider know of any changes you notice in your breasts, including changes in size, changes on the skin, pain or tenderness, or unusual fluid from your nipples. This information is not intended to replace advice given to you by your health care provider. Make sure you discuss any questions you have with your health care provider. Document Revised: 03/07/2018 Document Reviewed: 03/07/2018 Elsevier Patient Education  2020 Elsevier Inc.  

## 2019-09-14 NOTE — Progress Notes (Signed)
GYNECOLOGY ANNUAL PHYSICAL EXAM PROGRESS NOTE  Subjective:    Lauren Collier is a 54 y.o. [phone] female who presents for an annual exam. The patient has no complaints today. The patient is sexually active. The patient wears seatbelts: yes. The patient participates in regular exercise: no. Has the patient ever been transfused or tattooed?: no. The patient reports that there is not domestic violence in her life.   Recently increased dose of testosterone for decreased libido. Received increased dose 2 weeks ago, notes improvement in symptoms. Due for 1 more injection before taking a break. Denies any undesirable side effects. Wonders what else she may be able to use.    Gynecologic History  No LMP recorded (lmp unknown). Sometime in November (notes she does not get cycles when she takes the testosterone).  Contraception: none History of STI's:  Last Pap: 01/2016. Results were: normal. Denies h/o abnormal pap smears.  Last mammogram: 07/06/2018. Results were: normal Last colonoscopy: 09/2017.  Results were: normal.   OB History  Gravida Para Term Preterm AB Living  7 7 7  0 0 7  SAB TAB Ectopic Multiple Live Births  0 0 0 0 7    # Outcome Date GA Lbr Len/2nd Weight Sex Delivery Anes PTL Lv  7 Term 2008   6 lb (2.722 kg) F Vag-Spont   LIV  6 Term 2006   7 lb (3.175 kg) M Vag-Spont   LIV  5 Term 1999   6 lb (2.722 kg) F Vag-Spont   LIV  4 Term 1997   9 lb (4.082 kg) F Vag-Spont   LIV  3 Term 1991   9 lb (4.082 kg) F CS-LTranv   LIV     Complications: Breech birth  2 Term 1986   8 lb 2.6 oz (3.701 kg) M Vag-Spont   LIV  1 Term 1983   7 lb (3.175 kg) M Vag-Spont   LIV    Past Medical History:  Diagnosis Date  . GERD (gastroesophageal reflux disease)   . Hypertension     Past Surgical History:  Procedure Laterality Date  . APPENDECTOMY    . CESAREAN SECTION    . COLONOSCOPY WITH PROPOFOL N/A 10/05/2017   Procedure: COLONOSCOPY WITH PROPOFOL;  Surgeon: Virgel Manifold, MD;   Location: [facility] ENDOSCOPY;  Service: Endoscopy;  Laterality: N/A;  . COMBINED ABDOMINOPLASTY AND LIPOSUCTION  05/2019   performed in [place]  . ESOPHAGOGASTRODUODENOSCOPY (EGD) WITH PROPOFOL N/A 10/05/2017   Procedure: ESOPHAGOGASTRODUODENOSCOPY (EGD) WITH PROPOFOL;  Surgeon: Virgel Manifold, MD;  Location: [facility] ENDOSCOPY;  Service: Endoscopy;  Laterality: N/A;    Family History  Problem Relation Age of Onset  . Breast cancer Mother 36  . Diabetes Mother   . Heart disease Sister   . Colon cancer Maternal Grandmother   . Ovarian cancer Cousin        maternal    Social History   Socioeconomic History  . Marital status: Married    Spouse name: Not on file  . Number of children: Not on file  . Years of education: Not on file  . Highest education level: Not on file  Occupational History  . Not on file  Tobacco Use  . Smoking status: Never Smoker  . Smokeless tobacco: Never Used  Substance and Sexual Activity  . Alcohol use: Yes    Comment: social  . Drug use: No  . Sexual activity: Yes    Partners: Male    Birth control/protection:  None  Other Topics Concern  . Not on file  Social History Narrative  . Not on file   Social Determinants of Health   Financial Resource Strain:   . Difficulty of Paying Living Expenses: Not on file  Food Insecurity:   . Worried About Charity fundraiser in the Last Year: Not on file  . Ran Out of Food in the Last Year: Not on file  Transportation Needs:   . Lack of Transportation (Medical): Not on file  . Lack of Transportation (Non-Medical): Not on file  Physical Activity:   . Days of Exercise per Week: Not on file  . Minutes of Exercise per Session: Not on file  Stress:   . Feeling of Stress : Not on file  Social Connections:   . Frequency of Communication with Friends and Family: Not on file  . Frequency of Social Gatherings with Friends and Family: Not on file  . Attends Religious Services: Not on file  . Active  Member of Clubs or Organizations: Not on file  . Attends Archivist Meetings: Not on file  . Marital Status: Not on file  Intimate Partner Violence:   . Fear of Current or Ex-Partner: Not on file  . Emotionally Abused: Not on file  . Physically Abused: Not on file  . Sexually Abused: Not on file    Current Outpatient Medications on File Prior to Visit  Medication Sig Dispense Refill  . losartan-hydrochlorothiazide (HYZAAR) 100-12.5 MG tablet Take 1 tablet by mouth daily. 30 tablet 0  . testosterone cypionate (DEPOTESTOTERONE CYPIONATE) 100 MG/ML injection Inject into the muscle every 28 (twenty-eight) days. For IM use only     Current Facility-Administered Medications on File Prior to Visit  Medication Dose Route Frequency Provider Last Rate Last Admin  . testosterone cypionate (DEPOTESTOSTERONE CYPIONATE) injection 200 mg  200 mg Intramuscular Q28 days Rubie Maid, MD   75 mg at 08/31/19 [id number]    No Known Allergies    Review of Systems Constitutional: negative for chills, fatigue, fevers and sweats Eyes: negative for irritation, redness and visual disturbance Ears, nose, mouth, throat, and face: negative for hearing loss, nasal congestion, snoring and tinnitus Respiratory: negative for asthma, cough, sputum Cardiovascular: negative for chest pain, dyspnea, exertional chest pressure/discomfort, irregular heart beat, palpitations and syncope Gastrointestinal: negative for abdominal pain, change in bowel habits, nausea and vomiting Genitourinary: negative for abnormal menstrual periods, genital lesions, sexual problems and vaginal discharge, dysuria and urinary incontinence Integument/breast: negative for breast lump, breast tenderness and nipple discharge Hematologic/lymphatic: negative for bleeding and easy bruising Musculoskeletal:negative for back pain and muscle weakness. Positive for back soreness (still sore from liposuction procedure).  Neurological: negative for  dizziness, headaches, vertigo and weakness Endocrine: negative for diabetic symptoms including polydipsia, polyuria and skin dryness Allergic/Immunologic: negative for hay fever and urticaria        Objective:  Blood pressure 127/83, pulse 85, height 5\' 6"  (1.676 m), weight 225 lb 3.2 oz (102.2 kg). Body mass index is 36.35 kg/m.  General Appearance:    Alert, cooperative, no distress, appears stated age  Head:    Normocephalic, without obvious abnormality, atraumatic  Eyes:    PERRL, conjunctiva/corneas clear, EOM's intact, both eyes  Ears:    Normal external ear canals, both ears  Nose:   Nares normal, septum midline, mucosa normal, no drainage or sinus tenderness  Throat:   Lips, mucosa, and tongue normal; teeth and gums normal  Neck:   Supple, symmetrical, trachea  midline, no adenopathy; thyroid: no enlargement/tenderness/nodules; no carotid bruit or JVD  Back:     Symmetric, no curvature, ROM normal, no CVA tenderness  Lungs:     Clear to auscultation bilaterally, respirations unlabored  Chest Wall:    No tenderness or deformity   Heart:    Regular rate and rhythm, S1 and S2 normal, no murmur, rub or gallop  Breast Exam:    No tenderness, masses, or nipple abnormality  Abdomen:     Soft, non-tender, bowel sounds active all four quadrants, no masses, no organomegaly.    Genitalia:    Pelvic:external genitalia normal, vagina without lesions, discharge, or tenderness, rectovaginal septum  normal. Cervix normal in appearance, no cervical motion tenderness, no adnexal masses or tenderness.  Uterus normal size, shape, mobile, regular contours, nontender.  Rectal:    Normal external sphincter.  No hemorrhoids appreciated. Internal exam not done.   Extremities:   Extremities normal, atraumatic, no cyanosis or edema  Pulses:   2+ and symmetric all extremities  Skin:   Skin color, texture, turgor normal, no rashes or lesions  Lymph nodes:   Cervical, supraclavicular, and axillary nodes normal   Neurologic:   CNII-XII intact, normal strength, sensation and reflexes throughout   .  Labs:  Has labs done by PCP at [place]:    1. Encounter for well woman exam with routine gynecological exam   2. Decreased libido   3. Perimenopausal   4. Breast cancer screening by mammogram   5. Obesity (BMI 35.0-39.9 without comorbidity)   6. Essential hypertension     Plan:    Blood tests: None ordered.  Plans to have done by PCP in June. Breast self exam technique reviewed and patient encouraged to perform self-exam monthly. Contraception: none. Discussed healthy lifestyle modifications. Follow up in 1 year. Mammogram ordered. Pap smear performed today. Up to date on colonoscopy.  Up to date on flu vaccine (received in September).  Discussed other options for decreased libido, did not qualify for use of Vyleesi due to HTN medication.  Has tried herbal remedies in the past without success.  Discussed use of "scream cream", patient ok to try. Also inquired about Addyi, discussed that it was difficult to get covered by insurance, but will try to see if patient qualifies.   HTN managed by PCP.  Follow up in 1 month for last testosterone injection, and in 1 year for annual exam.    Rubie Maid, MD [facility]

## 2019-09-14 NOTE — Progress Notes (Signed)
Pt present for annual exam. Pt last pap 02/12/16. Last mammogram 07/06/18. Order placed for mammogram. Pt stated that she was doing well.

## 2019-09-18 LAB — CYTOLOGY - PAP
Comment: NEGATIVE
Diagnosis: NEGATIVE
High risk HPV: NEGATIVE

## 2019-09-27 ENCOUNTER — Ambulatory Visit (INDEPENDENT_AMBULATORY_CARE_PROVIDER_SITE_OTHER): Payer: Self-pay | Admitting: Obstetrics and Gynecology

## 2019-09-27 ENCOUNTER — Encounter: Payer: Self-pay | Admitting: Obstetrics and Gynecology

## 2019-09-27 ENCOUNTER — Other Ambulatory Visit: Payer: Self-pay

## 2019-09-27 VITALS — BP 132/82 | HR 77 | Ht 66.0 in | Wt 223.2 lb

## 2019-09-27 DIAGNOSIS — R6882 Decreased libido: Secondary | ICD-10-CM

## 2019-09-27 MED ORDER — TESTOSTERONE CYPIONATE 200 MG/ML IM SOLN
200.0000 mg | Freq: Once | INTRAMUSCULAR | Status: AC
  Administered 2019-09-27: 200 mg via INTRAMUSCULAR

## 2019-09-27 MED ORDER — TESTOSTERONE CYPIONATE 200 MG/ML IM SOLN: 100.0000 mg | INTRAMUSCULAR | 0 refills | Status: DC

## 2019-09-27 NOTE — Progress Notes (Signed)
I have reviewed the record and concur with patient management and plan. Patient can have 1 more injection before taking intermission with testosterone shots.   Rubie Maid, MD [facility]

## 2019-09-27 NOTE — Progress Notes (Signed)
Patient presents for a nurse visit for a testosterone injection. Tolerated well. Next injection due in 28 days.

## 2019-10-26 ENCOUNTER — Telehealth: Payer: Self-pay | Admitting: Surgical

## 2019-10-26 ENCOUNTER — Telehealth: Payer: Self-pay | Admitting: Obstetrics and Gynecology

## 2019-10-26 NOTE — Telephone Encounter (Signed)
Pt has appt Monday and I called the pt to pre-screen before her appt. The pt stated she doesn't have an refills at the pharmacy. Pt uses [facility] on [address]. Please advise

## 2019-10-26 NOTE — Telephone Encounter (Signed)
Spoke with patient to let her know that Dr. Marcelline Mates said she is not due again until June 2021. Told patient to call back in May to schedule for June. Patient verbalized understanding.

## 2019-10-26 NOTE — Telephone Encounter (Signed)
Patient called earlier this afternoon inquiring of her app she has scheduled for Monday. She states she is coming in for a nurse visit and normally picks up script before coming. She has not heard from pharmacy so she would like to know if we need to send in a refill. Testosterone injection pharmacy of choice [facility] in [place].

## 2019-10-26 NOTE — Telephone Encounter (Signed)
Previously discussed that patient should wait 3 months before resuming testosterone injections.

## 2019-10-26 NOTE — Telephone Encounter (Signed)
Please advise. No message.

## 2019-10-26 NOTE — Telephone Encounter (Signed)
Can you please send script for patient.

## 2019-10-27 NOTE — Telephone Encounter (Signed)
I have notified the patient of this.

## 2019-10-29 ENCOUNTER — Ambulatory Visit: Payer: Medicaid Other

## 2019-11-06 ENCOUNTER — Other Ambulatory Visit: Payer: Self-pay | Admitting: Obstetrics and Gynecology

## 2019-11-06 DIAGNOSIS — Z1231 Encounter for screening mammogram for malignant neoplasm of breast: Secondary | ICD-10-CM

## 2019-11-09 ENCOUNTER — Ambulatory Visit: Payer: Medicaid Other | Attending: Internal Medicine

## 2019-11-09 DIAGNOSIS — Z23 Encounter for immunization: Secondary | ICD-10-CM

## 2019-11-09 NOTE — Progress Notes (Signed)
   Covid-19 Vaccination Clinic  Name:  Lauren Collier    MRN: [id number] DOB: 1966/06/10  11/09/2019  [name] was observed post Covid-19 immunization for 15 minutes without incident. She was provided with Vaccine Information Sheet and instruction to access the V-Safe system.   [name] was instructed to call 911 with any severe reactions post vaccine: Marland Kitchen Difficulty breathing  . Swelling of face and throat  . A fast heartbeat  . A bad rash all over body  . Dizziness and weakness   Immunizations Administered    Name Date Dose VIS Date Route   Pfizer COVID-19 Vaccine 11/09/2019  4:12 PM 0.3 mL 07/13/2019 Intramuscular   Manufacturer: [place]   Lot: [id number]   [place]: [id number]

## 2019-12-04 ENCOUNTER — Ambulatory Visit: Payer: Medicaid Other | Attending: Internal Medicine

## 2019-12-04 DIAGNOSIS — Z23 Encounter for immunization: Secondary | ICD-10-CM

## 2019-12-04 NOTE — Progress Notes (Signed)
   Covid-19 Vaccination Clinic  Name:  Lauren Collier    MRN: [id number] DOB: [birth year]  12/04/2019  Ms. Lauren Collier was observed post Covid-19 immunization for 15 minutes without incident. She was provided with Vaccine Information Sheet and instruction to access the V-Safe system.   Ms. Lauren Collier was instructed to call 911 with any severe reactions post vaccine: Marland Kitchen Difficulty breathing  . Swelling of face and throat  . A fast heartbeat  . A bad rash all over body  . Dizziness and weakness   Immunizations Administered    Name Date Dose VIS Date Route   Pfizer COVID-19 Vaccine 12/04/2019  9:31 AM 0.3 mL 09/26/2018 Intramuscular   Manufacturer: [place]   Lot: [id number]   [place]: [id number]

## 2020-01-31 MED ORDER — TESTOSTERONE CYPIONATE 200 MG/ML IM SOLN: 150.0000 mg | INTRAMUSCULAR | 0 refills | Status: DC

## 2020-01-31 NOTE — Telephone Encounter (Signed)
-----   Message from Edwyna Shell, LPN sent at 08/10/1658  8:18 AM EDT ----- Regarding: medication refill Good morning Dr. Marcelline Mates, Pt needs a refill of her testosterone cypionate. Thanks [employer]

## 2020-02-18 ENCOUNTER — Ambulatory Visit (INDEPENDENT_AMBULATORY_CARE_PROVIDER_SITE_OTHER): Payer: Self-pay

## 2020-02-18 ENCOUNTER — Ambulatory Visit: Payer: Medicaid Other

## 2020-02-18 VITALS — BP 143/93 | HR 62 | Ht 66.0 in | Wt 217.7 lb

## 2020-02-18 DIAGNOSIS — R6882 Decreased libido: Secondary | ICD-10-CM

## 2020-02-18 MED ORDER — TESTOSTERONE CYPIONATE 200 MG/ML IM SOLN
200.0000 mg | INTRAMUSCULAR | Status: DC
  Administered 2020-02-18: 200 mg via INTRAMUSCULAR

## 2020-02-18 NOTE — Progress Notes (Signed)
Pt present for t-injection. Pt stated that she was doing well no problems. appt made to discuss other forms of treatment.

## 2020-02-20 ENCOUNTER — Ambulatory Visit (INDEPENDENT_AMBULATORY_CARE_PROVIDER_SITE_OTHER): Payer: Self-pay | Admitting: Obstetrics and Gynecology

## 2020-02-20 ENCOUNTER — Other Ambulatory Visit: Payer: Self-pay

## 2020-02-20 VITALS — Ht 66.0 in | Wt 217.6 lb

## 2020-02-20 DIAGNOSIS — R6882 Decreased libido: Secondary | ICD-10-CM

## 2020-02-20 NOTE — Progress Notes (Signed)
Pt having a televisit to discuss other treatment for decrease of sex drive. Pt stated that she was doing well and open for other options.

## 2020-02-21 ENCOUNTER — Encounter: Payer: Self-pay | Admitting: Obstetrics and Gynecology

## 2020-02-21 MED ORDER — ADDYI 100 MG PO TABS: 1.0000 | ORAL_TABLET | Freq: Every day | ORAL | 3 refills | Status: DC

## 2020-02-21 NOTE — Progress Notes (Addendum)
Virtual Visit via Telephone Note  I connected with [name] on 02/21/20 at  5:00 PM EDT by telephone and verified that I am speaking with the correct person using two identifiers.   Patient: Home Provider: Office  I discussed the limitations, risks, security and privacy concerns of performing an evaluation and management service by telephone and the availability of in person appointments. I also discussed with the patient that there may be a patient responsible charge related to this service. The patient expressed understanding and agreed to proceed.    History of Present Illness:   Lauren Collier is a 54 y.o. [phone] female who presents to discuss other options for management of her decreased libido.  She has recently resumed testosterone injections but desires to discuss alternative solutions. Last injection was last week.    Observations/Objective: Height 5\' 6"  (1.676 m), weight 217 lb 9.5 oz (98.7 kg).   Assessment and Plan:  Discussion had on other management options for decreased libido (HSSD).  Has been on testosterone intermittently over the past year or two. Attempted to prescribe Vyleesi in the past however patient was not a candidate. Discussed option for Addyi.  Advised on risks/benefits and side effects. Will prescribe. Advised to begin in 3 weeks once testosterone is out of her system. Patient will need to f/u in 2 months.   Follow Up Instructions:    I discussed the assessment and treatment plan with the patient. The patient was provided an opportunity to ask questions and all were answered. The patient agreed with the plan and demonstrated an understanding of the instructions.   The patient was advised to call back or seek an in-person evaluation if the symptoms worsen or if the condition fails to improve as anticipated.  I provided 8 minutes of non-face-to-face time during this encounter.   Rubie Maid, MD  [facility]

## 2020-02-26 ENCOUNTER — Encounter: Payer: Self-pay | Admitting: Obstetrics and Gynecology

## 2020-03-08 ENCOUNTER — Other Ambulatory Visit: Payer: Self-pay

## 2020-03-20 MED ORDER — LOSARTAN POTASSIUM-HCTZ 100-12.5 MG PO TABS: 1.0000 | ORAL_TABLET | Freq: Every day | ORAL | 1 refills | Status: DC

## 2020-03-20 NOTE — Telephone Encounter (Signed)
Please advise 

## 2020-04-24 ENCOUNTER — Ambulatory Visit (INDEPENDENT_AMBULATORY_CARE_PROVIDER_SITE_OTHER): Payer: Self-pay

## 2020-04-24 ENCOUNTER — Telehealth: Payer: Self-pay

## 2020-04-24 ENCOUNTER — Other Ambulatory Visit: Payer: Self-pay

## 2020-04-24 DIAGNOSIS — R6882 Decreased libido: Secondary | ICD-10-CM

## 2020-04-24 MED ORDER — TESTOSTERONE CYPIONATE 200 MG/ML IM SOLN
200.0000 mg | Freq: Once | INTRAMUSCULAR | Status: AC
  Administered 2020-05-23: 200 mg via INTRAMUSCULAR

## 2020-04-24 NOTE — Progress Notes (Signed)
Patient presents for a nurse visit for a testosterone injection. Tolerated well.

## 2020-04-24 NOTE — Telephone Encounter (Signed)
mychart message sent to patient re: [facility] #.

## 2020-05-23 ENCOUNTER — Other Ambulatory Visit: Payer: Self-pay

## 2020-05-23 ENCOUNTER — Ambulatory Visit (INDEPENDENT_AMBULATORY_CARE_PROVIDER_SITE_OTHER): Payer: Self-pay

## 2020-05-23 DIAGNOSIS — R6882 Decreased libido: Secondary | ICD-10-CM

## 2020-05-23 MED ORDER — TESTOSTERONE CYPIONATE 200 MG/ML IM SOLN: 200.0000 mg | Freq: Once | INTRAMUSCULAR | Status: DC

## 2020-05-23 NOTE — Progress Notes (Signed)
Patient presents for a NV for a testosterone injection. Tolerated well.

## 2020-06-16 MED ORDER — VYLEESI 1.75 MG/0.3ML ~~LOC~~ SOAJ: SUBCUTANEOUS | 12 refills | Status: DC

## 2020-06-16 NOTE — Addendum Note (Signed)
Addended by: Augusto Gamble on: 06/16/2020 09:52 AM   Modules accepted: Orders

## 2020-06-23 ENCOUNTER — Other Ambulatory Visit: Payer: Self-pay

## 2020-06-23 ENCOUNTER — Ambulatory Visit (INDEPENDENT_AMBULATORY_CARE_PROVIDER_SITE_OTHER): Payer: Self-pay

## 2020-06-23 VITALS — Ht 66.0 in

## 2020-06-23 DIAGNOSIS — R6882 Decreased libido: Secondary | ICD-10-CM

## 2020-06-23 MED ORDER — TESTOSTERONE CYPIONATE 200 MG/ML IM SOLN
200.0000 mg | INTRAMUSCULAR | Status: DC
  Administered 2020-06-23: 200 mg via INTRAMUSCULAR

## 2020-06-23 NOTE — Progress Notes (Signed)
Pt present for testosterone booster. Pt stated that she was doing well no problems. Pt scheduled an appointment for follow in 4 weeks.

## 2020-07-16 ENCOUNTER — Emergency Department
Admission: EM | Admit: 2020-07-16 | Discharge: 2020-07-16 | Disposition: A | Discharge status: Home / Self Care | Payer: Medicaid Other | Attending: Emergency Medicine | Admitting: Emergency Medicine

## 2020-07-16 ENCOUNTER — Emergency Department: Payer: Medicaid Other

## 2020-07-16 ENCOUNTER — Encounter: Payer: Self-pay | Admitting: Emergency Medicine

## 2020-07-16 ENCOUNTER — Other Ambulatory Visit: Payer: Self-pay

## 2020-07-16 DIAGNOSIS — Z79899 Other long term (current) drug therapy: Secondary | ICD-10-CM | POA: Insufficient documentation

## 2020-07-16 DIAGNOSIS — M25511 Pain in right shoulder: Secondary | ICD-10-CM

## 2020-07-16 DIAGNOSIS — M542 Cervicalgia: Secondary | ICD-10-CM | POA: Insufficient documentation

## 2020-07-16 DIAGNOSIS — I10 Essential (primary) hypertension: Secondary | ICD-10-CM | POA: Insufficient documentation

## 2020-07-16 DIAGNOSIS — Z85038 Personal history of other malignant neoplasm of large intestine: Secondary | ICD-10-CM | POA: Insufficient documentation

## 2020-07-16 LAB — BASIC METABOLIC PANEL
Anion gap: 8 (ref 5–15)
BUN: 15 mg/dL (ref 6–20)
CO2: 24 mmol/L (ref 22–32)
Calcium: 9.2 mg/dL (ref 8.9–10.3)
Chloride: 104 mmol/L (ref 98–111)
Creatinine, Ser: 1.11 mg/dL — ABNORMAL HIGH (ref 0.44–1.00)
GFR, Estimated: 59 mL/min — ABNORMAL LOW (ref >60)
Glucose, Bld: 91 mg/dL (ref 70–99)
Potassium: 4.1 mmol/L (ref 3.5–5.1)
Sodium: 136 mmol/L (ref 135–145)

## 2020-07-16 LAB — CBC
HCT: 48.6 % — ABNORMAL HIGH (ref 36.0–46.0)
Hemoglobin: 15.8 g/dL — ABNORMAL HIGH (ref 12.0–15.0)
MCH: 30.3 pg (ref 26.0–34.0)
MCHC: 32.5 g/dL (ref 30.0–36.0)
MCV: 93.3 fL (ref 80.0–100.0)
Platelets: 214 10*3/uL (ref 150–400)
RBC: 5.21 MIL/uL — ABNORMAL HIGH (ref 3.87–5.11)
RDW: 12.4 % (ref 11.5–15.5)
WBC: 9.6 10*3/uL (ref 4.0–10.5)
nRBC: 0 % (ref 0.0–0.2)

## 2020-07-16 LAB — TROPONIN I (HIGH SENSITIVITY)
Troponin I (High Sensitivity): 3 ng/L (ref <18)
Troponin I (High Sensitivity): 4 ng/L (ref <18)

## 2020-07-16 MED ORDER — LIDOCAINE 5 % EX PTCH
1.0000 | MEDICATED_PATCH | CUTANEOUS | Status: DC
  Administered 2020-07-16: 19:00:00 1 via TRANSDERMAL
  Filled 2020-07-16: qty 1

## 2020-07-16 MED ORDER — IBUPROFEN 600 MG PO TABS
600.0000 mg | ORAL_TABLET | Freq: Once | ORAL | Status: AC
  Administered 2020-07-16: 19:00:00 600 mg via ORAL
  Filled 2020-07-16: qty 1

## 2020-07-16 NOTE — ED Notes (Signed)
Pt reports pain to right arm/shoulder that radiates down arm into elbow area. Pain also radiates into back of shoulder.   Pt reports pain began Sunday morning. No known injury. Pt reports she's seen two doctors, who gave her muscle relaxers and naproxen. Pt reports no relief. Pt denies taking other pain medications, other than tylenol last night (no improvement).   Pt somewhat restless, says that arm hurts more when hanging down or when she ambulates.  No changes to ROM

## 2020-07-16 NOTE — ED Notes (Signed)
Pt denies pain with inspiration, chest pain, n/v/d, abd pain, fevers

## 2020-07-16 NOTE — ED Triage Notes (Signed)
Pt c/o R sided CP that radiates to her R shoulder and down her R arm, pt states saw by PCP and prescribed muscle relaxers without relief. Pt also c/o SOB at this time.

## 2020-07-16 NOTE — ED Notes (Signed)
Pt states she has not had any imaging done on her shoulder   No weakness or changed to grip strength  Pt did say that she thought the tourniquet for blood work drawn from that arm hurt "more than it should've," and states it was like a "pulling feeling"

## 2020-07-16 NOTE — ED Provider Notes (Signed)
[facility] Emergency Department Provider Note  ____________________________________________   Event Date/Time   First MD Initiated Contact with Patient 07/16/20 1758     (approximate)  I have reviewed the triage vital signs and the nursing notes.   HISTORY  Chief Complaint Chest Pain   HPI Lauren Collier is a 54 y.o. female with past medical history of HTN and GERD who presents for assessment of some right-sided posterior shoulder and neck pain that began 4 days ago.  Patient denies any falls injury trauma twisting or heavy lifting precipitating onset of pain.  She states it was at rest.  She states she initially went to urgent care who prescribed her muscle relaxant that did not seem to help.  Denies any pain in the front of her shoulder, chest, headache, earache, sore throat, fevers, chills, cough, rash, nausea, vomiting, diarrhea, dysuria, abdominal pain, lower extremity pain, left shoulder or left upper extremity pain or other pain in the right upper extremity.  No clear alleviating aggravating factors.  No prior similar episodes.         Past Medical History:  Diagnosis Date  . GERD (gastroesophageal reflux disease)   . Hypertension     Patient Active Problem List   Diagnosis Date Noted  . Decreased libido 07/05/2019  . Dysphagia   . Heartburn   . Gastro-esophageal reflux disease with esophagitis   . Special screening for malignant neoplasms, colon   . Benign neoplasm of descending colon   . Internal hemorrhoids   . Diverticulosis of large intestine without diverticulitis   . Climacteric 07/07/2016  . Abnormal uterine bleeding (AUB) 07/07/2016  . Essential hypertension 02/12/2016  . Obesity (BMI 30-39.9) 02/12/2016    Past Surgical History:  Procedure Laterality Date  . APPENDECTOMY    . CESAREAN SECTION    . COLONOSCOPY WITH PROPOFOL N/A 10/05/2017   Procedure: COLONOSCOPY WITH PROPOFOL;  Surgeon: Virgel Manifold, MD;  Location:  [facility] ENDOSCOPY;  Service: Endoscopy;  Laterality: N/A;  . COMBINED ABDOMINOPLASTY AND LIPOSUCTION  05/2019   performed in [place]  . ESOPHAGOGASTRODUODENOSCOPY (EGD) WITH PROPOFOL N/A 10/05/2017   Procedure: ESOPHAGOGASTRODUODENOSCOPY (EGD) WITH PROPOFOL;  Surgeon: Virgel Manifold, MD;  Location: [facility] ENDOSCOPY;  Service: Endoscopy;  Laterality: N/A;    Prior to Admission medications   Medication Sig Start Date End Date Taking? Authorizing Provider  Bremelanotide Acetate (VYLEESI) 1.75 MG/0.3ML SOAJ 1 syringe up to 3 times weekly as needed. 06/16/20   Rubie Maid, MD  losartan-hydrochlorothiazide (HYZAAR) 100-12.5 MG tablet Take 1 tablet by mouth daily. 03/20/20   Rubie Maid, MD    Allergies Patient has no known allergies.  Family History  Problem Relation Age of Onset  . Breast cancer Mother 23  . Diabetes Mother   . Heart disease Sister   . Colon cancer Maternal Grandmother   . Ovarian cancer Cousin        maternal    Social History Social History   Tobacco Use  . Smoking status: Never Smoker  . Smokeless tobacco: Never Used  Vaping Use  . Vaping Use: Never used  Substance Use Topics  . Alcohol use: Yes    Comment: social  . Drug use: No    Review of Systems  Review of Systems  Constitutional: Negative for chills and fever.  HENT: Negative for sore throat.   Eyes: Negative for pain.  Respiratory: Negative for cough and stridor.   Cardiovascular: Negative for chest pain.  Gastrointestinal: Negative for  vomiting.  Genitourinary: Negative for dysuria.  Musculoskeletal: Positive for myalgias ( R neck and shoulder).  Skin: Negative for rash.  Neurological: Negative for seizures, loss of consciousness and headaches.  Psychiatric/Behavioral: Negative for suicidal ideas.  All other systems reviewed and are negative.     ____________________________________________   PHYSICAL EXAM:  VITAL SIGNS: ED Triage Vitals  Enc Vitals Group     BP  07/16/20 1339 (!) 142/93     Pulse Rate 07/16/20 1339 77     Resp 07/16/20 1339 14     Temp 07/16/20 1339 98.7 F (37.1 C)     Temp Source 07/16/20 1339 Oral     SpO2 07/16/20 1339 97 %     Weight 07/16/20 1340 225 lb (102.1 kg)     Height 07/16/20 1340 5\' 6"  (1.676 m)     Head Circumference --      Peak Flow --      Pain Score 07/16/20 1343 10     Pain Loc --      Pain Edu? --      Excl. in [place]? --    Vitals:   07/16/20 1339 07/16/20 1642  BP: (!) 142/93 (!) 136/112  Pulse: 77 61  Resp: 14 16  Temp: 98.7 F (37.1 C) 98.7 F (37.1 C)  SpO2: 97% 98%   Physical Exam Vitals and nursing note reviewed.  Constitutional:      General: She is not in acute distress.    Appearance: She is well-developed and well-nourished.  HENT:     Head: Normocephalic and atraumatic.     Right Ear: External ear normal.     Left Ear: External ear normal.     Nose: Nose normal.     Mouth/Throat:     Mouth: Mucous membranes are moist.  Eyes:     Conjunctiva/sclera: Conjunctivae normal.  Cardiovascular:     Rate and Rhythm: Normal rate and regular rhythm.     Heart sounds: No murmur heard.   Pulmonary:     Effort: Pulmonary effort is normal. No respiratory distress.     Breath sounds: Normal breath sounds.  Abdominal:     Palpations: Abdomen is soft.     Tenderness: There is no abdominal tenderness.  Musculoskeletal:        General: No edema.     Cervical back: Neck supple.     Right lower leg: No edema.     Left lower leg: No edema.  Skin:    General: Skin is warm and dry.  Neurological:     Mental Status: She is alert and oriented to person, place, and time.  Psychiatric:        Mood and Affect: Mood and affect and mood normal.     Cranial nerves II through XII grossly intact.  Patient has full and symmetric strength as well as full range of motion throughout her bilateral upper and lower extremities.  Right shoulder has no effusion or overlying skin changes or other evidence of  trauma.  Patient is tender over her right trapezius.  Sensation is intact in the distribution of the radial ulnar and median nerves in the bilateral upper extremities.  2+ bilateral radial pulse.  No tenderness step-offs or deformities over the C/T-spine.  Oropharynx and face unremarkable. ____________________________________________   LABS (all labs ordered are listed, but only abnormal results are displayed)  Labs Reviewed  BASIC METABOLIC PANEL - Abnormal; Notable for the following components:      Result  Value   Creatinine, Ser 1.11 (*)    GFR, Estimated 59 (*)    All other components within normal limits  CBC - Abnormal; Notable for the following components:   RBC 5.21 (*)    Hemoglobin 15.8 (*)    HCT 48.6 (*)    All other components within normal limits  POC URINE PREG, ED  TROPONIN I (HIGH SENSITIVITY)  TROPONIN I (HIGH SENSITIVITY)   ____________________________________________  EKG  Sinus rhythm with a ventricular of 64, normal axis, unremarkable intervals, nonspecific T wave change in lead III and aVF as well as nonspecific changes in V5 and S4 without clear evidence of acute ischemia or other significant arrhythmia ____________________________________________  RADIOLOGY  ED MD interpretation: No right shoulder dislocation or fracture, pneumonia, pneumothorax, effusion edema or other acute thoracic process  Official radiology report(s): DG Chest 2 View  Result Date: 07/16/2020 CLINICAL DATA:  CP/SOB EXAM: CHEST - 2 VIEW COMPARISON:  05/10/2017. FINDINGS: No focal consolidation. No pneumothorax or pleural effusion. Cardiomediastinal silhouette is within normal limits. No acute osseous abnormality. IMPRESSION: No focal airspace disease. Electronically Signed   By: Primitivo Gauze M.D.   On: 07/16/2020 14:16    ____________________________________________   PROCEDURES  Procedure(s) performed (including Critical  Care):  Procedures   ____________________________________________   INITIAL IMPRESSION / ASSESSMENT AND PLAN / ED COURSE      Patient presents with [place] to history exam for evaluation of right posterior shoulder and neck pain.  On arrival she is afebrile hemodynamically stable.  Differential includes but is not limited to acute infectious process, ACS, PE, pneumonia, DVT, and muscle strain.  Chest x-ray has no evidence of pneumonia pneumothorax rib fracture or other acute process.  No findings on history or exam to suggest cellulitis septic joint or other acute infectious process.  There is no edema or other findings to suggest DVT in the right upper extremity.  Very low suspicion for ACS given 2 nonelevated troponins obtained over 2 hours.  BMP and CBC are unremarkable.  Given tenderness over the right trapezius impression is likely musculoskeletal etiology.  Patient given lidocaine patch and ibuprofen emergency room.  Discharged stable condition with instructions to follow-up with PCP.  ____________________________________________   FINAL CLINICAL IMPRESSION(S) / ED DIAGNOSES  Final diagnoses:  Acute pain of right shoulder    Medications  lidocaine (LIDODERM) 5 % 1 patch (has no administration in time range)  ibuprofen (ADVIL) tablet 600 mg (has no administration in time range)     ED Discharge Orders    None       Note:  This document was prepared using Dragon voice recognition software and may include unintentional dictation errors.   Lucrezia Starch, MD 07/16/20 [id number]

## 2020-07-22 ENCOUNTER — Encounter: Payer: Self-pay | Admitting: Obstetrics and Gynecology

## 2020-07-22 ENCOUNTER — Other Ambulatory Visit: Payer: Self-pay

## 2020-07-22 ENCOUNTER — Ambulatory Visit (INDEPENDENT_AMBULATORY_CARE_PROVIDER_SITE_OTHER): Payer: Self-pay | Admitting: Obstetrics and Gynecology

## 2020-07-22 VITALS — BP 140/85 | HR 86 | Ht 66.0 in | Wt 228.5 lb

## 2020-07-22 DIAGNOSIS — R6882 Decreased libido: Secondary | ICD-10-CM

## 2020-07-22 DIAGNOSIS — Z9189 Other specified personal risk factors, not elsewhere classified: Secondary | ICD-10-CM

## 2020-07-22 MED ORDER — ADDYI 100 MG PO TABS: 1.0000 | ORAL_TABLET | Freq: Every day | ORAL | 11 refills | Status: DC

## 2020-07-22 NOTE — Patient Instructions (Signed)
Flibanserin oral tablets What is this medicine? FLIBANSERIN (fly BAN ser in) is used to treat hypoactive (low) sexual desire disorder (HSDD) in women who have not gone through menopause, who have not had low sexual desire in the past, and who have low sexual desire no matter the type of sexual activity, the situation, or the sexual partner. Women with HSDD have a low sexual desire that is troubling to them, and is not due to a medical or mental health problem, problems in the relationship, medicines, or drug abuse. This medicine is not for HSDD in women who have gone through menopause. This medicine is not for men. This medicine not for use to improve sexual performance. This medicine may be used for other purposes; ask your health care provider or pharmacist if you have questions. COMMON BRAND NAME(S): Addyi What should I tell my health care provider before I take this medicine? They need to know if you have any of these conditions:  dehydration  if you drink alcohol  drug abuse or addiction  heart disease  history of depression or other mental health problems  history of a drug or alcohol abuse problem  liver disease  low blood pressure  an unusual or allergic reaction to flibanserin, other medicines, foods, dyes, or preservatives  pregnant or trying to get pregnant  breast-feeding How should I use this medicine? Take this medicine by mouth with a glass of water. Do not take with grapefruit juice. Follow the directions on the prescription label. This medicine should only be taken at bedtime. Taking it at a time other than bedtime can increase your risk for side effects such as low blood pressure, fainting, accidentaly injury, and daytime drowsiness. If you drink alcohol wait at least 2 hours after you stop drinking alcohol before taking your dose at bedtime. Another choice is to skip your dose at bedtime if you drink alcohol in the evening. After taking your bedtime dose, do not  drink alcohol until the next day. Take your medicine at regular intervals. Do not take it more often than directed. Do not stop taking except on your doctor's advice. A special MedGuide will be given to you by the pharmacist with each prescription and refill. Be sure to read this information carefully each time. Talk to your pediatrician regarding the use of this medicine in children. This medicine is not for use in children. Overdosage: If you think you have taken too much of this medicine contact a poison control center or emergency room at once. NOTE: This medicine is only for you. Do not share this medicine with others. What if I miss a dose? If you miss your dose at bedtime, skip the missed dose and take the next dose at bedtime the next day. Do not take this medicine the next morning or double your next dose. What may interact with this medicine? Do not take this medicine with any of the following medications:  certain antivirals for HIV or hepatitis  certain medicines for fungal infections like fluconazole, ketoconazole, itraconazole, or posaconazole  ciprofloxacin  clarithromycin  conivaptan  diltiazem  erythromycin  grapefruit juice  nefazodone  telithromycin  verapamil This medicine may also interact with the following medications:  alcohol  birth control pills  bupropion  certain medicines for anxiety or sleep  certain medicines for seizures like carbamazepine, phenobarbital, phenytoin  certain medicines for stomach problems like cimetidine, esomeprazole, dexlansoprazole, lansoprazole, omeprazole, pantoprazole, rabeprazole, ranitidine  digoxin  diphenhydramine  etravirine  fluoxetine  fluvoxamine  ginkgo biloba  lorcaserin  narcotic medicines for pain  resveratrol  rifabutin  rifampin  rifapentine  sirolimus  St. John's Wort This list may not describe all possible interactions. Give your health care provider a list of all the  medicines, herbs, non-prescription drugs, or dietary supplements you use. Also tell them if you smoke, drink alcohol, or use illegal drugs. Some items may interact with your medicine. What should I watch for while using this medicine? Visit your doctor or health care professional for regular checks on your progress. Tell your doctor if your symptoms have not improved after you have taken this medicine for 8 weeks. You may get dizzy or drowsy. Do not drive, use machinery, or do anything that needs mental alertness for at least 6 hours after you take your dose and until you know how this medicine affects you. The risk of severe drowsiness is increased if you are also taking other medicines that cause drowsiness, or if you take this medicine during waking hours. Only take this medicine at bedtime. Alcohol can increase dizziness and drowsiness, and can increase the risk of low blood pressure or fainting spells when combined with this medicine. If you drink alcohol wait at least 2 hours after you stop drinking alcohol before taking your medicine at bedtime. Alternatively, skip your bedtime dose if you drink alcohol in the evening. After you have taken your medicine at bedtime do not drink alcohol until the following day. Do not stand or sit up quickly. This reduces the risk of dizzy or fainting spells. This medicine can cause low blood pressure, sometimes with dizziness and fainting spells. If you begin to feel dizzy or lightheaded, lie down and call for help if the symptoms don't go away. This medicine is only available through a restricted program called the ADDYI REMS Program, and can only be obtained through certified pharmacies participating in the program. For more information about the Program and a list of pharmacies that are enrolled in the Program, go to www.AddyiREMS.com or call 1-844-PINK-PILL [phone]). What side effects may I notice from receiving this medicine? Side effects that you should  report to your doctor or health care professional as soon as possible:  allergic reactions like skin rash, itching or hives, swelling of the face, lips, or tongue  extreme drowsiness  signs and symptoms of low blood pressure like dizziness; feeling faint or lightheaded, falls; unusually weak or tired Side effects that usually do not require medical attention (report to your doctor or health care professional if they continue or are bothersome):  dry mouth  nausea  tiredness  trouble sleeping This list may not describe all possible side effects. Call your doctor for medical advice about side effects. You may report side effects to FDA at 1-800-FDA-1088. Where should I keep my medicine? Keep out of the reach of children. Store at room temperature between 15 and 30 degrees C (59 and 86 degrees F). Throw away any unused medicine after the expiration date. NOTE: This sheet is a summary. It may not cover all possible information. If you have questions about this medicine, talk to your doctor, pharmacist, or health care provider.  2020 Elsevier/Gold Standard (2018-05-02 14:13:32)

## 2020-07-22 NOTE — Progress Notes (Signed)
    GYNECOLOGY PROGRESS NOTE  Subjective:    Patient ID: JORDON BOURQUIN, female    DOB: 09-May-1966, 54 y.o.   MRN: [id number]  HPI  Patient is a 54 y.o. [phone] female who presents for further management of decreased libido.  Patient has been using testosterone injections for several months.  Has been using beyond the recommended 3 doses.  Prescribed Vyleesi last month, however patient notes that she did not pick up the prescription because the cost was approximately $1000. Of note, patient also has been prescribed Addyi in the past, however notes that she never started the medication as she read over the pamphlet and had concerns in affecting her blood pressure.  Additionally, patient desires to discuss her wishes for fertility.  Notes that she and her husband have been discussing attempting to have another baby.  This is her second marriage, she has been with her current partner for 8 years, however only been married for 4 of those years.  He has 2 children (female) however desires one more child (preferably a boy) as he otherwise has a small extended family. Patient also has several children of her own from her first marriage.  Her current husband is 53 years old.  Notes that they have been discussing IVF.  The following portions of the patient's history were reviewed and updated as appropriate: allergies, current medications, past family history, past medical history, past social history, past surgical history and problem list.  Review of Systems Pertinent items noted in HPI and remainder of comprehensive ROS otherwise negative.   Objective:   Blood pressure 140/85, pulse 86, height 5\' 6"  (1.676 m), weight 228 lb 8 oz (103.6 kg). General appearance: alert and no distress Remainder of exam deferred.    Assessment:   Decreased libido At risk for fertility issues  Plan:   1.  Decreased libido -advised at this time that I could not continue to administer the testosterone injections as  patient has utilized for several months already and would need to take a break.  I previously recommended use of medication that she could take on a daily basis that had less long-term side effects.  Patient unable to take Vyleesi due to her insurance.  Revisited discussion of use of Addyi.  Patient notes that this would be more affordable at $99 per month although she currently has the first bottle that was sent to her for free as a trial.  Given reassurance that her blood pressures were acceptable range and would likely not cause any major issues.  Patient is in agreement to try for 1 month. 2.  Discussed patient's and husband's desires for future fertility.  Would like to discuss IVF with a specialist.  Patient lives in [place], would prefer to go to [facility] [facility] [facility].  She does report that her periods have been absent for the past 6 months after she began taking a fertility tea.  Also discussed with patient that at her age of the likelihood of perimenopausal status was indeed a consideration.  Discussed options such as use of an egg donor, a surrogate or could even consider even adoption.   RTC in 1 month to reassess libido symptoms on new medication.    A total of 15 minutes were spent face-to-face with the patient during this encounter and over half of that time dealt with counseling and coordination of care.   Rubie Maid, MD [facility]

## 2020-07-22 NOTE — Progress Notes (Signed)
Pt present today to discuss other options of treatment for libido.

## 2020-08-25 ENCOUNTER — Other Ambulatory Visit: Payer: Self-pay | Admitting: Obstetrics and Gynecology

## 2020-08-25 ENCOUNTER — Other Ambulatory Visit: Payer: Self-pay

## 2020-08-25 MED ORDER — ADDYI 100 MG PO TABS: 1.0000 | ORAL_TABLET | Freq: Every day | ORAL | 11 refills | Status: DC

## 2020-08-26 ENCOUNTER — Ambulatory Visit (INDEPENDENT_AMBULATORY_CARE_PROVIDER_SITE_OTHER): Payer: Medicaid Other | Admitting: Obstetrics and Gynecology

## 2020-08-26 ENCOUNTER — Other Ambulatory Visit: Payer: Self-pay

## 2020-08-26 ENCOUNTER — Encounter: Payer: Self-pay | Admitting: Obstetrics and Gynecology

## 2020-08-26 VITALS — BP 130/96 | HR 89 | Ht 66.0 in | Wt 224.8 lb

## 2020-08-26 DIAGNOSIS — F520 Hypoactive sexual desire disorder: Secondary | ICD-10-CM

## 2020-08-26 DIAGNOSIS — I10 Essential (primary) hypertension: Secondary | ICD-10-CM

## 2020-08-26 NOTE — Progress Notes (Signed)
    GYNECOLOGY PROGRESS NOTE  Subjective:    Patient ID: Lauren Collier, female    DOB: [birth year], 55 y.o.   MRN: [id number]  HPI  Patient is a 55 y.o. [id number] female who presents for 1 month f/u of HSDD.  She reports that she has taken a full month of the Addyi and loves it.  Really noticing good results. Denies any side effects.  Notes that her husband has also been dealing with libido issues and finally went for evaluation and was noted to have low testosterone levels.    The following portions of the patient's history were reviewed and updated as appropriate: allergies, current medications, past family history, past medical history, past social history, past surgical history and problem list.  Review of Systems Pertinent items noted in HPI and remainder of comprehensive ROS otherwise negative.   Objective:   Blood pressure (!) 130/96, pulse 89, height 5\' 6"  (1.676 m), weight 224 lb 12.8 oz (102 kg). General appearance: alert and no distress Remainder of exam deferred.  Assessment:   1. Hypoactive sexual desire disorder   2. Essential hypertension    Plan:   1. Patient doing well with Addyi. No side effects. BPs still remain overall wnl.  Refill given, also given coupon card as medication not covered by Medicaid.  2. Patient to f/u in 5 months to reassess libido and for annual exam.   Rubie Maid, MD

## 2020-08-26 NOTE — Progress Notes (Signed)
Pt present for follow up decrease libido. Pt stated that she really like Addyi. Pt stated that she would like to stay on the medication.

## 2020-08-26 NOTE — Patient Instructions (Signed)
Flibanserin Oral Tablets What is this medicine? FLIBANSERIN (fly BAN ser in) is used to treat hypoactive (low) sexual desire disorder (HSDD) in women who have not gone through menopause, who have not had low sexual desire in the past, and who have low sexual desire no matter the type of sexual activity, the situation, or the sexual partner. Women with HSDD have a low sexual desire that is troubling to them, and is not due to a medical or mental health problem, problems in the relationship, medicines, or drug abuse. This medicine is not for HSDD in women who have gone through menopause. This medicine is not for men. This medicine not for use to improve sexual performance. This medicine may be used for other purposes; ask your health care provider or pharmacist if you have questions. COMMON BRAND NAME(S): Addyi What should I tell my health care provider before I take this medicine? They need to know if you have any of these conditions:  dehydration  if you drink alcohol  drug abuse or addiction  heart disease  history of depression or other mental health problems  history of a drug or alcohol abuse problem  liver disease  low blood pressure  an unusual or allergic reaction to flibanserin, other medicines, foods, dyes, or preservatives  pregnant or trying to get pregnant  breast-feeding How should I use this medicine? Take this medicine by mouth with water. Take it as directed on the prescription label. This medicine should only be taken at bedtime. Do not take this medicine with grapefruit juice. If you have 1 or 2 alcohol-containing drinks, wait at least 2 hours before taking this medicine at bedtime. Do not take your bedtime dose if you have consumed 3 or more alcohol-containing drinks. After taking this medicine at bedtime, do not drink alcohol until the next day. A special MedGuide will be given to you by the pharmacist with each prescription and refill. Be sure to read this  information carefully each time. Talk to your health care provider about the use of this medicine in children. It is not approved for use in children. Overdosage: If you think you have taken too much of this medicine contact a poison control center or emergency room at once. NOTE: This medicine is only for you. Do not share this medicine with others. What if I miss a dose? If you miss your dose at bedtime, skip the missed dose and take the next dose at bedtime the next day. Do not take this medicine the next morning or double your next dose. What may interact with this medicine? Do not take this medicine with any of the following medications:  berotralstat  certain antivirals for HIV or hepatitis  certain medicines for fungal infections like fluconazole, ketoconazole, itraconazole, or posaconazole  ciprofloxacin  clarithromycin  conivaptan  diltiazem  erythromycin  grapefruit juice  nefazodone  telithromycin  verapamil This medicine may also interact with the following medications:  alcohol  birth control pills  bupropion  certain medicines for anxiety or sleep  certain medicines for seizures like carbamazepine, phenobarbital, phenytoin  certain medicines for stomach problems like cimetidine, esomeprazole, dexlansoprazole, lansoprazole, omeprazole, pantoprazole, rabeprazole, ranitidine  digoxin  diphenhydramine  etravirine  fluoxetine  fluvoxamine  ginkgo biloba  lorcaserin  narcotic medicines for pain  resveratrol  rifabutin  rifampin  rifapentine  sirolimus  St. John's Wort This list may not describe all possible interactions. Give your health care provider a list of all the medicines, herbs, non-prescription drugs, or  dietary supplements you use. Also tell them if you smoke, drink alcohol, or use illegal drugs. Some items may interact with your medicine. What should I watch for while using this medicine? Visit your doctor or health care  provider for regular checks on your progress. Tell your health care provider if your symptoms do not start to get better after you have taken this medicine for 8 weeks. You may get drowsy or dizzy. Do not drive, use machinery, or do anything that needs mental alertness for at least 6 hours after your dose and until you know how this medicine affects you. Do not stand up or sit up quickly. This reduces the risk of dizzy or fainting spells. Alcohol can increase dizziness and drowsiness, and can increase the risk of low blood pressure or fainting spells when combined with this medicine. Wait at least 2 hours after consuming 1 or 2 standard alcoholic drinks before taking this medicine at bedtime. Do not take this medicine at bedtime if you have consumed 3 or more standard alcoholic drinks that evening. What side effects may I notice from receiving this medicine? Side effects that you should report to your doctor or health care professional as soon as possible:  allergic reactions (skin rash, itching or hives; swelling of the face, lips, or tongue)  extreme drowsiness  low blood pressure (dizziness; feeling faint or lightheaded, falls; unusually weak or tired) Side effects that usually do not require medical attention (report to your doctor or health care professional if they continue or are bothersome):  dry mouth  nausea  tiredness  trouble sleeping This list may not describe all possible side effects. Call your doctor for medical advice about side effects. You may report side effects to FDA at 1-800-FDA-1088. Where should I keep my medicine? Keep out of the reach of children and pets. Store at [employer] [address] (77 degrees F). Get rid of any unused medicine after the expiration date. To get rid of medicines that are no longer needed or have expired:  Take the medicine to a medicine take-back program. Check with your pharmacy or law enforcement to find a location.  If you cannot return the  medicine, check the label or package insert to see if the medicine should be thrown out in the garbage or flushed down the toilet. If you are not sure, ask your health care provider. If it is safe to put it in the trash, the medicine out of the container. Mix the medicine with cat litter, dirt, coffee grounds, or other unwanted substance. Seal the mixture in a bag or container. Put it in the trash. NOTE: This sheet is a summary. It may not cover all possible information. If you have questions about this medicine, talk to your doctor, pharmacist, or health care provider.  2021 Elsevier/Gold Standard (2020-05-05 17:05:06)

## 2020-08-27 ENCOUNTER — Other Ambulatory Visit: Payer: Self-pay | Admitting: Obstetrics and Gynecology

## 2020-08-27 NOTE — Telephone Encounter (Signed)
Please advise. Thanks Caley Ciaramitaro 

## 2020-08-27 NOTE — Telephone Encounter (Signed)
Okay thank you

## 2020-08-27 NOTE — Telephone Encounter (Signed)
I already sent this in for the patient. This medication works for the patient and there is no alternative so I declined the request from the pharmacy for an alternative. Patient is aware that Medicaid does not cover.

## 2020-10-16 DIAGNOSIS — R6882 Decreased libido: Secondary | ICD-10-CM

## 2020-10-23 NOTE — Telephone Encounter (Signed)
Please advise. Thanks Von Quintanar 

## 2020-10-27 ENCOUNTER — Other Ambulatory Visit: Payer: Self-pay

## 2020-10-27 NOTE — Progress Notes (Signed)
Error

## 2020-10-29 ENCOUNTER — Other Ambulatory Visit: Payer: Medicaid Other

## 2020-11-03 ENCOUNTER — Other Ambulatory Visit: Payer: Medicaid Other

## 2020-11-03 ENCOUNTER — Other Ambulatory Visit: Payer: Self-pay

## 2020-11-03 DIAGNOSIS — R6882 Decreased libido: Secondary | ICD-10-CM

## 2020-11-04 LAB — TESTOSTERONE, FREE, TOTAL, SHBG
Sex Hormone Binding: 25.5 nmol/L (ref 17.3–125.0)
Testosterone, Free: 3.8 pg/mL (ref 0.0–4.2)
Testosterone: 267 ng/dL — ABNORMAL HIGH (ref 4–50)

## 2020-11-04 LAB — DHEA-SULFATE: DHEA-SO4: 64.6 ug/dL (ref 41.2–243.7)

## 2020-12-25 ENCOUNTER — Other Ambulatory Visit: Payer: Self-pay | Admitting: Nurse Practitioner

## 2020-12-25 DIAGNOSIS — R1900 Intra-abdominal and pelvic swelling, mass and lump, unspecified site: Secondary | ICD-10-CM

## 2021-01-27 ENCOUNTER — Encounter: Payer: Medicaid Other | Admitting: Obstetrics and Gynecology

## 2021-02-03 ENCOUNTER — Encounter: Payer: Self-pay | Admitting: Obstetrics and Gynecology

## 2021-02-03 ENCOUNTER — Other Ambulatory Visit: Payer: Self-pay

## 2021-02-03 ENCOUNTER — Ambulatory Visit (INDEPENDENT_AMBULATORY_CARE_PROVIDER_SITE_OTHER): Payer: Medicaid Other | Admitting: Obstetrics and Gynecology

## 2021-02-03 VITALS — BP 144/95 | HR 83 | Ht 66.0 in | Wt 222.1 lb

## 2021-02-03 DIAGNOSIS — Z01419 Encounter for gynecological examination (general) (routine) without abnormal findings: Secondary | ICD-10-CM

## 2021-02-03 DIAGNOSIS — E669 Obesity, unspecified: Secondary | ICD-10-CM

## 2021-02-03 DIAGNOSIS — Z1231 Encounter for screening mammogram for malignant neoplasm of breast: Secondary | ICD-10-CM

## 2021-02-03 DIAGNOSIS — F520 Hypoactive sexual desire disorder: Secondary | ICD-10-CM

## 2021-02-03 DIAGNOSIS — I10 Essential (primary) hypertension: Secondary | ICD-10-CM | POA: Diagnosis not present

## 2021-02-03 NOTE — Progress Notes (Signed)
Pt present for annual exam and follow up for libido.  Pt stated that she stopped taking the Addyi because it was not working for her.

## 2021-02-03 NOTE — Progress Notes (Signed)
GYNECOLOGY ANNUAL PHYSICAL EXAM PROGRESS NOTE  Subjective:    Lauren Collier is a 55 y.o. [phone] female who presents for an annual exam. The patient is sexually active. The patient wears seatbelts: yes. The patient participates in regular exercise: no. Has the patient ever been transfused or tattooed?: no. The patient reports that there is not domestic violence in her life.   The patient has the following complaints today:  Patient reports that she stopped her Addyi after 1-2 months of use as she felt like it was not helping as much anymore as when she first started the medication.  Last use was ~ 1 month ago.  Libido is "ok at the moment", still lacks motivation or urge to initiate, however once engaged in intercourse is still pleasurable.  Reports that she and her partner are still considering conception. Have seen a reproductive specialist in [place]. Notes that they would recommend embryo implantation.    Gynecologic History   Cannot recall last cycle.  Stopped having cycles when using testosterone last year.  Contraception: none History of STI's:  Last Pap: 09/14/2019. Results were: normal. Denies h/o abnormal pap smears.  Last mammogram: 07/06/2018. Results were: normal Last colonoscopy: 09/2017.  Results were: normal.    OB History  Gravida Para Term Preterm AB Living  7 7 7  0 0 7  SAB IAB Ectopic Multiple Live Births  0 0 0 0 7    # Outcome Date GA Lbr Len/2nd Weight Sex Delivery Anes PTL Lv  7 Term 2008   6 lb (2.722 kg) F Vag-Spont   LIV  6 Term 2006   7 lb (3.175 kg) M Vag-Spont   LIV  5 Term 1999   6 lb (2.722 kg) F Vag-Spont   LIV  4 Term 1997   9 lb (4.082 kg) F Vag-Spont   LIV  3 Term 1991   9 lb (4.082 kg) F CS-LTranv   LIV     Complications: Breech birth  2 Term 1986   8 lb 2.6 oz (3.701 kg) M Vag-Spont   LIV  1 Term 1983   7 lb (3.175 kg) M Vag-Spont   LIV    Past Medical History:  Diagnosis Date   GERD (gastroesophageal reflux disease)    Hypertension      Past Surgical History:  Procedure Laterality Date   APPENDECTOMY     CESAREAN SECTION     COLONOSCOPY WITH PROPOFOL N/A 10/05/2017   Procedure: COLONOSCOPY WITH PROPOFOL;  Surgeon: Virgel Manifold, MD;  Location: [facility] ENDOSCOPY;  Service: Endoscopy;  Laterality: N/A;   COMBINED ABDOMINOPLASTY AND LIPOSUCTION  05/2019   performed in [place]   ESOPHAGOGASTRODUODENOSCOPY (EGD) WITH PROPOFOL N/A 10/05/2017   Procedure: ESOPHAGOGASTRODUODENOSCOPY (EGD) WITH PROPOFOL;  Surgeon: Virgel Manifold, MD;  Location: [facility] ENDOSCOPY;  Service: Endoscopy;  Laterality: N/A;    Family History  Problem Relation Age of Onset   Breast cancer Mother 59   Diabetes Mother    Heart disease Sister    Colon cancer Maternal Grandmother    Ovarian cancer Cousin        maternal    Social History   Socioeconomic History   Marital status: Married    Spouse name: Not on file   Number of children: Not on file   Years of education: Not on file   Highest education level: Not on file  Occupational History   Not on file  Tobacco Use   Smoking status: Never  Smokeless tobacco: Never  Vaping Use   Vaping Use: Never used  Substance and Sexual Activity   Alcohol use: Yes    Comment: social   Drug use: No   Sexual activity: Yes    Partners: Male    Birth control/protection: None  Other Topics Concern   Not on file  Social History Narrative   Not on file   Social Determinants of Health   Financial Resource Strain: Not on file  Food Insecurity: Not on file  Transportation Needs: Not on file  Physical Activity: Not on file  Stress: Not on file  Social Connections: Not on file  Intimate Partner Violence: Not on file    Current Outpatient Medications on File Prior to Visit  Medication Sig Dispense Refill   losartan-hydrochlorothiazide (HYZAAR) 100-12.5 MG tablet Take 1 tablet by mouth daily. 90 tablet 1   No current facility-administered medications on file prior to visit.     No Known Allergies    Review of Systems Constitutional: negative for chills, fatigue, fevers and sweats Eyes: negative for irritation, redness and visual disturbance Ears, nose, mouth, throat, and face: negative for hearing loss, nasal congestion, snoring and tinnitus Respiratory: negative for asthma, cough, sputum Cardiovascular: negative for chest pain, dyspnea, exertional chest pressure/discomfort, irregular heart beat, palpitations and syncope Gastrointestinal: negative for abdominal pain, change in bowel habits, nausea and vomiting Genitourinary: negative for abnormal menstrual periods, genital lesions, and vaginal discharge, dysuria and urinary incontinence. Positive for decreased libido.  Integument/breast: negative for breast lump, breast tenderness and nipple discharge Hematologic/lymphatic: negative for bleeding and easy bruising Musculoskeletal:negative for back pain and muscle weakness.  Neurological: negative for dizziness, headaches, vertigo and weakness Endocrine: negative for diabetic symptoms including polydipsia, polyuria and skin dryness Allergic/Immunologic: negative for hay fever and urticaria        Objective:  Blood pressure (!) 144/95, pulse 83, height 5\' 6"  (1.676 m), weight 222 lb 1.6 oz (100.7 kg). Body mass index is 35.85 kg/m.  General Appearance:    Alert, cooperative, no distress, appears stated age  Head:    Normocephalic, without obvious abnormality, atraumatic  Eyes:    PERRL, conjunctiva/corneas clear, EOM's intact, both eyes  Ears:    Normal external ear canals, both ears  Nose:   Nares normal, septum midline, mucosa normal, no drainage or sinus tenderness  Throat:   Lips, mucosa, and tongue normal; teeth and gums normal  Neck:   Supple, symmetrical, trachea midline, no adenopathy; thyroid: no enlargement/tenderness/nodules; no carotid bruit or JVD  Back:     Symmetric, no curvature, ROM normal, no CVA tenderness  Lungs:     Clear to  auscultation bilaterally, respirations unlabored  Chest Wall:    No tenderness or deformity   Heart:    Regular rate and rhythm, S1 and S2 normal, no murmur, rub or gallop  Breast Exam:    No tenderness, masses, or nipple abnormality  Abdomen:     Soft, non-tender, bowel sounds active all four quadrants, no masses, no organomegaly.    Genitalia:    Pelvic:external genitalia normal, vagina without lesions, discharge, or tenderness, rectovaginal septum  normal. Cervix normal in appearance, no cervical motion tenderness, no adnexal masses or tenderness.  Uterus normal size, shape, mobile, regular contours, nontender.  Rectal:    Normal external sphincter.  No hemorrhoids appreciated. Internal exam not done.   Extremities:   Extremities normal, atraumatic, no cyanosis or edema  Pulses:   2+ and symmetric all extremities  Skin:  Skin color, texture, turgor normal, no rashes or lesions  Lymph nodes:   Cervical, supraclavicular, and axillary nodes normal  Neurologic:   CNII-XII intact, normal strength, sensation and reflexes throughout   .  Labs:  Results for JANE, BIRKEL" (MRN [id number]) as of 02/03/2021 20:07  Ref. Range 11/03/2020 08:46  DHEA-SO4 Latest Ref Range: 41.2 - 243.7 ug/dL 64.6  Sex Horm Binding Glob, Serum Latest Ref Range: 17.3 - 125.0 nmol/L 25.5  Testosterone Latest Ref Range: 4 - 50 ng/dL 267 (H)  Testosterone Free Latest Ref Range: 0.0 - 4.2 pg/mL 3.8    Assessment:    1. Encounter for well woman exam with routine gynecological exam   2. Breast cancer screening by mammogram   3. Hypoactive sexual desire disorder   4. Essential hypertension   5. Obesity (BMI 35.0-39.9 without comorbidity)     Plan:    - Blood tests: See labs. Patient notes she has not seen her PCP this year. Will order comprehensive labs.  - Breast self exam technique reviewed and patient encouraged to perform self-exam monthly. - Contraception: none. Desires to conceive. Seen by [facility].  -  Discussed healthy lifestyle modifications. - Mammogram ordered. - Pap smear up to date. - Up to date on colonoscopy.  - Discussed management of libido. Patient ultimately desires to resume testosterone injections as these worked best, however recent labs in April noting elevated levels. Advised that at this time it is best to resume the Addyi. Reiterated that it can take up to 3 months to experience maximum results. Also discussed will check FSH/LH, testosterone levels. Can possibly resume testosterone at a later date once levels normalize. Or if menopausal, can consider HRT containing testosterone. - HTN managed by PCP. Encouraged to follow up.  - COVID vaccination status: has completed series. Due for booster.  - Follow up in 1 year for annual exam.    Rubie Maid, MD [facility]

## 2021-02-03 NOTE — Patient Instructions (Signed)
Breast Self-Awareness Breast self-awareness is knowing how your breasts look and feel. Doing breast self-awareness is important. It allows you to catch a breast problem early while it is still small and can be treated. All women should do breast self-awareness, including women who have had breast implants. Tell your doctorif you notice a change in your breasts. What you need: A mirror. A well-lit room. How to do a breast self-exam A breast self-exam is one way to learn what is normal for your breasts and tocheck for changes. To do a breast self-exam: Look for changes  Take off all the clothes above your waist. Stand in front of a mirror in a room with good lighting. Put your hands on your hips. Push your hands down. Look at your breasts and nipples in the mirror to see if one breast or nipple looks different from the other. Check to see if: The shape of one breast is different. The size of one breast is different. There are wrinkles, dips, and bumps in one breast and not the other. Look at each breast for changes in the skin, such as: Redness. Scaly areas. Look for changes in your nipples, such as: Liquid around the nipples. Bleeding. Dimpling. Redness. A change in where the nipples are.  Feel for changes  Lie on your back on the floor. Feel each breast. To do this, follow these steps: Pick a breast to feel. Put the arm closest to that breast above your head. Use your other arm to feel the nipple area of your breast. Feel the area with the pads of your three middle fingers by making small circles with your fingers. For the first circle, press lightly. For the second circle, press harder. For the third circle, press even harder. Keep making circles with your fingers at the different pressures as you move down your breast. Stop when you feel your ribs. Move your fingers a little toward the center of your body. Start making circles with your fingers again, this time going up until  you reach your collarbone. Keep making up-and-down circles until you reach your armpit. Remember to keep using the three pressures. Feel the other breast in the same way. Sit or stand in the tub or shower. With soapy water on your skin, feel each breast the same way you did in step 2 when you were lying on the floor.  Write down what you find Writing down what you find can help you remember what to tell your doctor. Write down: What is normal for each breast. Any changes you find in each breast, including: The kind of changes you find. Whether you have pain. Size and location of any lumps. When you last had your menstrual period. General tips Check your breasts every month. If you are breastfeeding, the best time to check your breasts is after you feed your baby or after you use a breast pump. If you get menstrual periods, the best time to check your breasts is 5-7 days after your menstrual period is over. With time, you will become comfortable with the self-exam, and you will begin to know if there are changes in your breasts. Contact a doctor if you: See a change in the shape or size of your breasts or nipples. See a change in the skin of your breast or nipples, such as red or scaly skin. Have fluid coming from your nipples that is not normal. Find a lump or thick area that was not there before. Have pain in   your breasts. Have any concerns about your breast health. Summary Breast self-awareness includes looking for changes in your breasts, as well as feeling for changes within your breasts. Breast self-awareness should be done in front of a mirror in a well-lit room. You should check your breasts every month. If you get menstrual periods, the best time to check your breasts is 5-7 days after your menstrual period is over. Let your doctor know of any changes you see in your breasts, including changes in size, changes on the skin, pain or tenderness, or fluid from your nipples that is  not normal. This information is not intended to replace advice given to you by your health care provider. Make sure you discuss any questions you have with your healthcare provider. Document Revised: 03/07/2018 Document Reviewed: 03/07/2018 Elsevier Patient Education  2022 Elsevier Inc.     Preventive Care 27-71 Years Old, Female Preventive care refers to lifestyle choices and visits with your health care provider that can promote health and wellness. This includes: A yearly physical exam. This is also called an annual wellness visit. Regular dental and eye exams. Immunizations. Screening for certain conditions. Healthy lifestyle choices, such as: Eating a healthy diet. Getting regular exercise. Not using drugs or products that contain nicotine and tobacco. Limiting alcohol use. What can I expect for my preventive care visit? Physical exam Your health care provider will check your: Height and weight. These may be used to calculate your BMI (body mass index). BMI is a measurement that tells if you are at a healthy weight. Heart rate and blood pressure. Body temperature. Skin for abnormal spots. Counseling Your health care provider may ask you questions about your: Past medical problems. Family's medical history. Alcohol, tobacco, and drug use. Emotional well-being. Home life and relationship well-being. Sexual activity. Diet, exercise, and sleep habits. Work and work Statistician. Access to firearms. Method of birth control. Menstrual cycle. Pregnancy history. What immunizations do I need?  Vaccines are usually given at various ages, according to a schedule. Your health care provider will recommend vaccines for you based on your age, medicalhistory, and lifestyle or other factors, such as travel or where you work. What tests do I need? Blood tests Lipid and cholesterol levels. These may be checked every 5 years, or more often if you are over 32 years old. Hepatitis C  test. Hepatitis B test. Screening Lung cancer screening. You may have this screening every year starting at age 53 if you have a 30-pack-year history of smoking and currently smoke or have quit within the past 15 years. Colorectal cancer screening. All adults should have this screening starting at age 19 and continuing until age 15. Your health care provider may recommend screening at age 46 if you are at increased risk. You will have tests every 1-10 years, depending on your results and the type of screening test. Diabetes screening. This is done by checking your blood sugar (glucose) after you have not eaten for a while (fasting). You may have this done every 1-3 years. Mammogram. This may be done every 1-2 years. Talk with your health care provider about when you should start having regular mammograms. This may depend on whether you have a family history of breast cancer. BRCA-related cancer screening. This may be done if you have a family history of breast, ovarian, tubal, or peritoneal cancers. Pelvic exam and Pap test. This may be done every 3 years starting at age 42. Starting at age 15, this may be done every  5 years if you have a Pap test in combination with an HPV test. Other tests STD (sexually transmitted disease) testing, if you are at risk. Bone density scan. This is done to screen for osteoporosis. You may have this scan if you are at high risk for osteoporosis. Talk with your health care provider about your test results, treatment options,and if necessary, the need for more tests. Follow these instructions at home: Eating and drinking  Eat a diet that includes fresh fruits and vegetables, whole grains, lean protein, and low-fat dairy products. Take vitamin and mineral supplements as recommended by your health care provider. Do not drink alcohol if: Your health care provider tells you not to drink. You are pregnant, may be pregnant, or are planning to become pregnant. If  you drink alcohol: Limit how much you have to 0-1 drink a day. Be aware of how much alcohol is in your drink. In the [place] one drink equals one 12 oz bottle of beer (355 mL), one 5 oz glass of wine (148 mL), or one 1 oz glass of hard liquor (44 mL).  Lifestyle Take daily care of your teeth and gums. Brush your teeth every morning and night with fluoride toothpaste. Floss one time each day. Stay active. Exercise for at least 30 minutes 5 or more days each week. Do not use any products that contain nicotine or tobacco, such as cigarettes, e-cigarettes, and chewing tobacco. If you need help quitting, ask your health care provider. Do not use drugs. If you are sexually active, practice safe sex. Use a condom or other form of protection to prevent STIs (sexually transmitted infections). If you do not wish to become pregnant, use a form of birth control. If you plan to become pregnant, see your health care provider for a prepregnancy visit. If told by your health care provider, take low-dose aspirin daily starting at age 50. Find healthy ways to cope with stress, such as: Meditation, yoga, or listening to music. Journaling. Talking to a trusted person. Spending time with friends and family. Safety Always wear your seat belt while driving or riding in a vehicle. Do not drive: If you have been drinking alcohol. Do not ride with someone who has been drinking. When you are tired or distracted. While texting. Wear a helmet and other protective equipment during sports activities. If you have firearms in your house, make sure you follow all gun safety procedures. What's next? Visit your health care provider once a year for an annual wellness visit. Ask your health care provider how often you should have your eyes and teeth checked. Stay up to date on all vaccines. This information is not intended to replace advice given to you by your health care provider. Make sure you discuss any questions you  have with your healthcare provider. Document Revised: 04/22/2020 Document Reviewed: 03/30/2018 Elsevier Patient Education  2022 Elsevier Inc.  

## 2021-02-04 ENCOUNTER — Encounter: Payer: Self-pay | Admitting: Obstetrics and Gynecology

## 2021-02-04 LAB — CBC
Hematocrit: 42.9 % (ref 34.0–46.6)
Hemoglobin: 14.2 g/dL (ref 11.1–15.9)
MCH: 30 pg (ref 26.6–33.0)
MCHC: 33.1 g/dL (ref 31.5–35.7)
MCV: 91 fL (ref 79–97)
Platelets: 245 10*3/uL (ref 150–450)
RBC: 4.74 x10E6/uL (ref 3.77–5.28)
RDW: 11.7 % (ref 11.7–15.4)
WBC: 7.3 10*3/uL (ref 3.4–10.8)

## 2021-02-04 LAB — LIPID PANEL
Chol/HDL Ratio: 2.9 ratio (ref 0.0–4.4)
Cholesterol, Total: 158 mg/dL (ref 100–199)
HDL: 55 mg/dL (ref >39)
LDL Chol Calc (NIH): 89 mg/dL (ref 0–99)
Triglycerides: 71 mg/dL (ref 0–149)
VLDL Cholesterol Cal: 14 mg/dL (ref 5–40)

## 2021-02-04 LAB — COMPREHENSIVE METABOLIC PANEL
ALT: 17 IU/L (ref 0–32)
AST: 23 IU/L (ref 0–40)
Albumin/Globulin Ratio: 1.6 (ref 1.2–2.2)
Albumin: 4.4 g/dL (ref 3.8–4.9)
Alkaline Phosphatase: 43 IU/L — ABNORMAL LOW (ref 44–121)
BUN/Creatinine Ratio: 14 (ref 9–23)
BUN: 17 mg/dL (ref 6–24)
Bilirubin Total: 1 mg/dL (ref 0.0–1.2)
CO2: 30 mmol/L — ABNORMAL HIGH (ref 20–29)
Calcium: 9.6 mg/dL (ref 8.7–10.2)
Chloride: 101 mmol/L (ref 96–106)
Creatinine, Ser: 1.25 mg/dL — ABNORMAL HIGH (ref 0.57–1.00)
Globulin, Total: 2.7 g/dL (ref 1.5–4.5)
Glucose: 81 mg/dL (ref 65–99)
Potassium: 4 mmol/L (ref 3.5–5.2)
Sodium: 142 mmol/L (ref 134–144)
Total Protein: 7.1 g/dL (ref 6.0–8.5)
eGFR: 52 mL/min/{1.73_m2} — ABNORMAL LOW (ref >59)

## 2021-02-04 LAB — PROGESTERONE: Progesterone: 0.1 ng/mL

## 2021-02-04 LAB — TESTOSTERONE, FREE, TOTAL, SHBG
Sex Hormone Binding: 24.6 nmol/L (ref 17.3–125.0)
Testosterone, Free: 3.3 pg/mL (ref 0.0–4.2)
Testosterone: 185 ng/dL — ABNORMAL HIGH (ref 4–50)

## 2021-02-04 LAB — TSH: TSH: 1.13 u[IU]/mL (ref 0.450–4.500)

## 2021-02-04 LAB — HEMOGLOBIN A1C
Est. average glucose Bld gHb Est-mCnc: 94 mg/dL
Hgb A1c MFr Bld: 4.9 % (ref 4.8–5.6)

## 2021-02-04 LAB — FSH/LH
FSH: 38.1 m[IU]/mL
LH: 46.5 m[IU]/mL

## 2021-02-04 LAB — ESTRADIOL: Estradiol: 18.5 pg/mL

## 2021-04-18 ENCOUNTER — Other Ambulatory Visit: Payer: Self-pay | Admitting: Obstetrics and Gynecology

## 2021-10-23 ENCOUNTER — Encounter: Payer: Self-pay | Admitting: Obstetrics and Gynecology

## 2021-11-26 ENCOUNTER — Encounter: Payer: Self-pay | Admitting: Obstetrics and Gynecology

## 2022-04-12 ENCOUNTER — Other Ambulatory Visit: Payer: Self-pay | Admitting: Nurse Practitioner

## 2022-04-12 DIAGNOSIS — Z1231 Encounter for screening mammogram for malignant neoplasm of breast: Secondary | ICD-10-CM

## 2022-05-06 ENCOUNTER — Ambulatory Visit: Payer: Medicaid Other

## 2023-04-26 ENCOUNTER — Telehealth: Payer: Self-pay | Admitting: Obstetrics and Gynecology

## 2023-04-26 NOTE — Telephone Encounter (Signed)
Patient has not been seen since 01/2021.  Would need to come in for an appointment. Is most likely due for an annual as well.

## 2023-04-26 NOTE — Telephone Encounter (Signed)
Patient called and wanted to know when should she schedule her T-booster.  Please Advise

## 2023-04-27 NOTE — Telephone Encounter (Signed)
This task is complete.  Patient is scheduled for 05/18/2023 at 1:15 pm

## 2023-05-18 ENCOUNTER — Encounter: Payer: Self-pay | Admitting: Obstetrics and Gynecology

## 2023-05-18 ENCOUNTER — Ambulatory Visit: Payer: Medicaid Other | Admitting: Obstetrics and Gynecology

## 2023-05-18 ENCOUNTER — Other Ambulatory Visit (HOSPITAL_COMMUNITY)
Admission: RE | Admit: 2023-05-18 | Discharge: 2023-05-18 | Disposition: A | Discharge status: Home / Self Care | Payer: Medicaid Other | Source: Ambulatory Visit | Attending: Obstetrics and Gynecology | Admitting: Obstetrics and Gynecology

## 2023-05-18 VITALS — BP 120/87 | HR 78 | Resp 16 | Ht 67.0 in | Wt 208.5 lb

## 2023-05-18 DIAGNOSIS — Z124 Encounter for screening for malignant neoplasm of cervix: Secondary | ICD-10-CM | POA: Diagnosis present

## 2023-05-18 DIAGNOSIS — Z01419 Encounter for gynecological examination (general) (routine) without abnormal findings: Secondary | ICD-10-CM

## 2023-05-18 DIAGNOSIS — Z23 Encounter for immunization: Secondary | ICD-10-CM | POA: Diagnosis not present

## 2023-05-18 DIAGNOSIS — Z131 Encounter for screening for diabetes mellitus: Secondary | ICD-10-CM

## 2023-05-18 DIAGNOSIS — R6882 Decreased libido: Secondary | ICD-10-CM

## 2023-05-18 DIAGNOSIS — I10 Essential (primary) hypertension: Secondary | ICD-10-CM

## 2023-05-18 DIAGNOSIS — Z1159 Encounter for screening for other viral diseases: Secondary | ICD-10-CM

## 2023-05-18 DIAGNOSIS — Z13220 Encounter for screening for lipoid disorders: Secondary | ICD-10-CM

## 2023-05-18 NOTE — Progress Notes (Unsigned)
ANNUAL PREVENTATIVE CARE GYNECOLOGY  ENCOUNTER NOTE  Subjective:       Lauren Collier is a 57 y.o. [phone] female here for a routine annual gynecologic exam. The patient is sexually active. The patient is not taking hormone replacement therapy. Patient denies post-menopausal vaginal bleeding. The patient wears seatbelts: yes. The patient participates in regular exercise: yes. Has the patient ever been transfused or tattooed?: no. The patient reports that there is not domestic violence in her life.  Current complaints: 1.  Desires to resume testosterone injections for her decreased libido. Has been off the medications for slightly over 1 year.  Notes that her libiod is practically non-existent.    Gynecologic History No LMP recorded (lmp unknown). Patient is postmenopausal. Contraception: post menopausal status Last Pap: 09/14/2019. Results were: normal Last mammogram: 05/13/2023. Results were: normal Last Colonoscopy: 10/05/2017: 10 years    Obstetric History OB History  Gravida Para Term Preterm AB Living  7 7 7     7   SAB IAB Ectopic Multiple Live Births          7    # Outcome Date GA Lbr Len/2nd Weight Sex Type Anes PTL Lv  7 Term 2008   6 lb (2.722 kg) F Vag-Spont   LIV  6 Term 2006   7 lb (3.175 kg) M Vag-Spont   LIV  5 Term 1999   6 lb (2.722 kg) F Vag-Spont   LIV  4 Term 1997   9 lb (4.082 kg) F Vag-Spont   LIV  3 Term 1991   9 lb (4.082 kg) F CS-LTranv   LIV     Complications: Breech birth  2 Term 1986   8 lb 2.6 oz (3.701 kg) M Vag-Spont   LIV  1 Term 1983   7 lb (3.175 kg) M Vag-Spont   LIV    Past Medical History:  Diagnosis Date   GERD (gastroesophageal reflux disease)    Hypertension     Family History  Problem Relation Age of Onset   Breast cancer Mother 18   Diabetes Mother    Heart disease Sister    Colon cancer Maternal Grandmother    Ovarian cancer Cousin        maternal    Past Surgical History:  Procedure Laterality Date   APPENDECTOMY      CESAREAN SECTION     COLONOSCOPY WITH PROPOFOL N/A 10/05/2017   Procedure: COLONOSCOPY WITH PROPOFOL;  Surgeon: Pasty Spillers, MD;  Location: [facility] ENDOSCOPY;  Service: Endoscopy;  Laterality: N/A;   COMBINED ABDOMINOPLASTY AND LIPOSUCTION  05/2019   performed in [place]   ESOPHAGOGASTRODUODENOSCOPY (EGD) WITH PROPOFOL N/A 10/05/2017   Procedure: ESOPHAGOGASTRODUODENOSCOPY (EGD) WITH PROPOFOL;  Surgeon: Pasty Spillers, MD;  Location: [facility] ENDOSCOPY;  Service: Endoscopy;  Laterality: N/A;    Social History   Socioeconomic History   Marital status: Married    Spouse name: Not on file   Number of children: Not on file   Years of education: Not on file   Highest education level: Not on file  Occupational History   Not on file  Tobacco Use   Smoking status: Never   Smokeless tobacco: Never  Vaping Use   Vaping status: Never Used  Substance and Sexual Activity   Alcohol use: Yes    Comment: social   Drug use: No   Sexual activity: Yes    Partners: Male    Birth control/protection: None  Other Topics Concern  Not on file  Social History Narrative   Not on file   Social Determinants of Health   Financial Resource Strain: Not on file  Food Insecurity: No Food Insecurity (04/29/2023)   Received from [facility]   Hunger Vital Sign    Worried About Running Out of Food in the Last Year: Never true    Ran Out of Food in the Last Year: Never true  Transportation Needs: No Transportation Needs (04/29/2023)   Received [facility] - Transportation    Lack of Transportation (Medical): No    Lack of Transportation (Non-Medical): No  Physical Activity: Inactive (05/02/2018)   Exercise Vital Sign    Days of Exercise per Week: 0 days    Minutes of Exercise per Session: 0 min  Stress: Not on file  Social Connections: Not on file  Intimate Partner Violence: Not At Risk (04/29/2023)   Received from [facility]   Humiliation, Afraid, Rape,  and Kick questionnaire    Fear of Current or Ex-Partner: No    Emotionally Abused: No    Physically Abused: No    Sexually Abused: No    Current Outpatient Medications on File Prior to Visit  Medication Sig Dispense Refill   losartan-hydrochlorothiazide (HYZAAR) 100-12.5 MG tablet Take 1 tablet by mouth once daily 90 tablet 3   No current facility-administered medications on file prior to visit.    No Known Allergies    Review of Systems ROS Review of Systems - General ROS: negative for - chills, fatigue, fever, hot flashes, night sweats, weight gain or weight loss Psychological ROS: negative for - anxiety, decreased libido, depression, mood swings, physical abuse or sexual abuse Ophthalmic ROS: negative for - blurry vision, eye pain or loss of vision ENT ROS: negative for - headaches, hearing change, visual changes or vocal changes Allergy and Immunology ROS: negative for - hives, itchy/watery eyes or seasonal allergies Hematological and Lymphatic ROS: negative for - bleeding problems, bruising, swollen lymph nodes or weight loss Endocrine ROS: negative for - galactorrhea, hair pattern changes, hot flashes, malaise/lethargy, mood swings, palpitations, polydipsia/polyuria, skin changes, temperature intolerance or unexpected weight changes Breast ROS: negative for - new or changing breast lumps or nipple discharge Respiratory ROS: negative for - cough or shortness of breath Cardiovascular ROS: negative for - chest pain, irregular heartbeat, palpitations or shortness of breath Gastrointestinal ROS: no abdominal pain, change in bowel habits, or black or bloody stools Genito-Urinary ROS: no dysuria, trouble voiding, or hematuria. Positive for decreased libido.  Musculoskeletal ROS: negative for - joint pain or joint stiffness Neurological ROS: negative for - bowel and bladder control changes Dermatological ROS: negative for rash and skin lesion changes   Objective:   BP 120/87    Pulse 78   Resp 16   Ht 5\' 7"  (1.702 m)   Wt 208 lb 8 oz (94.6 kg)   LMP  (LMP Unknown)   BMI 32.66 kg/m  CONSTITUTIONAL: Well-developed, well-nourished female in no acute distress.  PSYCHIATRIC: Normal mood and affect. Normal behavior. Normal judgment and thought content. NEUROLGIC: Alert and oriented to person, place, and time. Normal muscle tone coordination. No cranial nerve deficit noted. HENT:  Normocephalic, atraumatic, External right and left ear normal. Oropharynx is clear and moist EYES: Conjunctivae and EOM are normal. Pupils are equal, round, and reactive to light. No scleral icterus.  NECK: Normal range of motion, supple, no masses.  Normal thyroid.  SKIN: Skin is warm and dry.  No rash noted. Not diaphoretic. No erythema. No pallor. CARDIOVASCULAR: Normal heart rate noted, regular rhythm, no murmur. RESPIRATORY: Clear to auscultation bilaterally. Effort and breath sounds normal, no problems with respiration noted. BREASTS: Symmetric in size. No masses, skin changes, nipple drainage, or lymphadenopathy. ABDOMEN: Soft, normal bowel sounds, no distention noted.  No tenderness, rebound or guarding.  BLADDER: Normal PELVIC:  Bladder no bladder distension noted  Urethra: normal appearing urethra with no masses, tenderness or lesions  Vulva: normal appearing vulva with no masses, tenderness or lesions  Vagina: normal appearing vagina with normal color and discharge, no lesions  Cervix: normal appearing cervix without discharge or lesions  Uterus: uterus is normal size, shape, consistency and nontender  Adnexa: normal adnexa in size, nontender and no masses  RV: External Exam NormaI, No Rectal Masses, and Normal Sphincter tone  MUSCULOSKELETAL: Normal range of motion. No tenderness.  No cyanosis, clubbing, or edema.  2+ distal pulses. LYMPHATIC: No Axillary, Supraclavicular, or Inguinal Adenopathy.   Labs:  Reports labs performed by her PCP several months again.     Assessment:   1. Encounter for well woman exam with routine gynecological exam   2. Cervical cancer screening   3. Essential hypertension   4. Encounter for hepatitis C screening test for low risk patient   5. Encounter for immunization   6. Decreased libido      Plan:  Pap: Pap Co Test performed today Mammogram:  UTD Colon Screening:   UTD Labs:  Hep C (low risk screening), Testosterone. All other labs up to date per patient.  Routine preventative health maintenance measures emphasized:  Self Breast Exam and Exercise/Diet/Weight control Flu vaccine status: Received today.  COVID Vaccination status: Eligible for "booster" vaccine.  Encourage to receive Shingles vaccine.  Patient desires to resume testosterone injections for decreased libido. Will prescribe. Can continue therapy for maximum of 4 months. Weight based dosing gives initial dosing at 0.47 ml.  Return to Clinic - 1 Year for annual exam. RTC in 1 month for f/u of decreased libido and next injection.    Hildred Laser, MD [facility]

## 2023-05-18 NOTE — Telephone Encounter (Signed)
This encounter was created in error - please disregard.

## 2023-05-18 NOTE — Patient Instructions (Signed)
Preventive Care 85-56 Years Old, Female Preventive care refers to lifestyle choices and visits with your health care provider that can promote health and wellness. Preventive care visits are also called wellness exams. What can I expect for my preventive care visit? Counseling Your health care provider may ask you questions about your: Medical history, including: Past medical problems. Family medical history. Pregnancy history. Current health, including: Menstrual cycle. Method of birth control. Emotional well-being. Home life and relationship well-being. Sexual activity and sexual health. Lifestyle, including: Alcohol, nicotine or tobacco, and drug use. Access to firearms. Diet, exercise, and sleep habits. Work and work Astronomer. Sunscreen use. Safety issues such as seatbelt and bike helmet use. Physical exam Your health care provider will check your: Height and weight. These may be used to calculate your BMI (body mass index). BMI is a measurement that tells if you are at a healthy weight. Waist circumference. This measures the distance around your waistline. This measurement also tells if you are at a healthy weight and may help predict your risk of certain diseases, such as type 2 diabetes and high blood pressure. Heart rate and blood pressure. Body temperature. Skin for abnormal spots. What immunizations do I need?  Vaccines are usually given at various ages, according to a schedule. Your health care provider will recommend vaccines for you based on your age, medical history, and lifestyle or other factors, such as travel or where you work. What tests do I need? Screening Your health care provider may recommend screening tests for certain conditions. This may include: Lipid and cholesterol levels. Diabetes screening. This is done by checking your blood sugar (glucose) after you have not eaten for a while (fasting). Pelvic exam and Pap test. Hepatitis B test. Hepatitis C  test. HIV (human immunodeficiency virus) test. STI (sexually transmitted infection) testing, if you are at risk. Lung cancer screening. Colorectal cancer screening. Mammogram. Talk with your health care provider about when you should start having regular mammograms. This may depend on whether you have a family history of breast cancer. BRCA-related cancer screening. This may be done if you have a family history of breast, ovarian, tubal, or peritoneal cancers. Bone density scan. This is done to screen for osteoporosis. Talk with your health care provider about your test results, treatment options, and if necessary, the need for more tests. Follow these instructions at home: Eating and drinking  Eat a diet that includes fresh fruits and vegetables, whole grains, lean protein, and low-fat dairy products. Take vitamin and mineral supplements as recommended by your health care provider. Do not drink alcohol if: Your health care provider tells you not to drink. You are pregnant, may be pregnant, or are planning to become pregnant. If you drink alcohol: Limit how much you have to 0-1 drink a day. Know how much alcohol is in your drink. In the [place] one drink equals one 12 oz bottle of beer (355 mL), one 5 oz glass of wine (148 mL), or one 1 oz glass of hard liquor (44 mL). Lifestyle Brush your teeth every morning and night with fluoride toothpaste. Floss one time each day. Exercise for at least 30 minutes 5 or more days each week. Do not use any products that contain nicotine or tobacco. These products include cigarettes, chewing tobacco, and vaping devices, such as e-cigarettes. If you need help quitting, ask your health care provider. Do not use drugs. If you are sexually active, practice safe sex. Use a condom or other form of protection to  prevent STIs. If you do not wish to become pregnant, use a form of birth control. If you plan to become pregnant, see your health care provider for a  prepregnancy visit. Take aspirin only as told by your health care provider. Make sure that you understand how much to take and what form to take. Work with your health care provider to find out whether it is safe and beneficial for you to take aspirin daily. Find healthy ways to manage stress, such as: Meditation, yoga, or listening to music. Journaling. Talking to a trusted person. Spending time with friends and family. Minimize exposure to UV radiation to reduce your risk of skin cancer. Safety Always wear your seat belt while driving or riding in a vehicle. Do not drive: If you have been drinking alcohol. Do not ride with someone who has been drinking. When you are tired or distracted. While texting. If you have been using any mind-altering substances or drugs. Wear a helmet and other protective equipment during sports activities. If you have firearms in your house, make sure you follow all gun safety procedures. Seek help if you have been physically or sexually abused. What's next? Visit your health care provider once a year for an annual wellness visit. Ask your health care provider how often you should have your eyes and teeth checked. Stay up to date on all vaccines. This information is not intended to replace advice given to you by your health care provider. Make sure you discuss any questions you have with your health care provider. Document Revised: 01/14/2021 Document Reviewed: 01/14/2021 Elsevier Patient Education  2024 Elsevier Inc. Breast Self-Awareness Breast self-awareness is knowing how your breasts look and feel. You need to: Check your breasts on a regular basis. Tell your doctor about any changes. Become familiar with the look and feel of your breasts. This can help you catch a breast problem while it is still small and can be treated. You should do breast self-exams even if you have breast implants. What you need: A mirror. A well-lit room. A pillow or other  soft object. How to do a breast self-exam Follow these steps to do a breast self-exam: Look for changes  Take off all the clothes above your waist. Stand in front of a mirror in a room with good lighting. Put your hands down at your sides. Compare your breasts in the mirror. Look for any difference between them, such as: A difference in shape. A difference in size. Wrinkles, dips, and bumps in one breast and not the other. Look at each breast for changes in the skin, such as: Redness. Scaly areas. Skin that has gotten thicker. Dimpling. Open sores (ulcers). Look for changes in your nipples, such as: Fluid coming out of a nipple. Fluid around a nipple. Bleeding. Dimpling. Redness. A nipple that looks pushed in (retracted), or that has changed position. Feel for changes Lie on your back. Feel each breast. To do this: Pick a breast to feel. Place a pillow under the shoulder closest to that breast. Put the arm closest to that breast behind your head. Feel the nipple area of that breast using the hand of your other arm. Feel the area with the pads of your three middle fingers by making small circles with your fingers. Use light, medium, and firm pressure. Continue the overlapping circles, moving downward over the breast. Keep making circles with your fingers. Stop when you feel your ribs. Start making circles with your fingers again, this time going  upward until you reach your collarbone. Then, make circles outward across your breast and into your armpit area. Squeeze your nipple. Check for discharge and lumps. Repeat these steps to check your other breast. Sit or stand in the tub or shower. With soapy water on your skin, feel each breast the same way you did when you were lying down. Write down what you find Writing down what you find can help you remember what to tell your doctor. Write down: What is normal for each breast. Any changes you find in each breast. These  include: The kind of changes you find. A tender or painful breast. Any lump you find. Write down its size and where it is. When you last had your monthly period (menstrual cycle). General tips If you are breastfeeding, the best time to check your breasts is after you feed your baby or after you use a breast pump. If you get monthly bleeding, the best time to check your breasts is 5-7 days after your monthly cycle ends. With time, you will become comfortable with the self-exam. You will also start to know if there are changes in your breasts. Contact a doctor if: You see a change in the shape or size of your breasts or nipples. You see a change in the skin of your breast or nipples, such as red or scaly skin. You have fluid coming from your nipples that is not normal. You find a new lump or thick area. You have breast pain. You have any concerns about your breast health. Summary Breast self-awareness includes looking for changes in your breasts and feeling for changes within your breasts. You should do breast self-awareness in front of a mirror in a well-lit room. If you get monthly periods (menstrual cycles), the best time to check your breasts is 5-7 days after your period ends. Tell your doctor about any changes you see in your breasts. Changes include changes in size, changes on the skin, painful or tender breasts, or fluid from your nipples that is not normal. This information is not intended to replace advice given to you by your health care provider. Make sure you discuss any questions you have with your health care provider. Document Revised: 12/24/2021 Document Reviewed: 05/21/2021 Elsevier Patient Education  2024 [employer].

## 2023-05-18 NOTE — Telephone Encounter (Signed)
I contacted the patient via phone. She confirmed scheduling 4 week follow up for 11/20 at 9:55 am with DR. Cherry.

## 2023-05-18 NOTE — Telephone Encounter (Signed)
Pt calling back, pls call her back to schedule appt.

## 2023-05-19 ENCOUNTER — Encounter: Payer: Self-pay | Admitting: Obstetrics and Gynecology

## 2023-05-19 MED ORDER — TESTOSTERONE CYPIONATE 100 MG/ML IM SOLN: 47.0000 mg | INTRAMUSCULAR | 0 refills | Status: DC

## 2023-05-20 LAB — TESTOSTERONE, FREE, TOTAL, SHBG
Sex Hormone Binding: 37.5 nmol/L (ref 17.3–125.0)
Testosterone, Free: 0.2 pg/mL (ref 0.0–4.2)
Testosterone: <3 ng/dL — ABNORMAL LOW (ref 4–50)

## 2023-05-20 LAB — HEPATITIS C ANTIBODY: Hep C Virus Ab: NONREACTIVE

## 2023-05-23 ENCOUNTER — Other Ambulatory Visit: Payer: Self-pay | Admitting: Obstetrics and Gynecology

## 2023-05-24 LAB — CYTOLOGY - PAP
Comment: NEGATIVE
Diagnosis: NEGATIVE
High risk HPV: NEGATIVE

## 2023-05-25 ENCOUNTER — Ambulatory Visit: Payer: Medicaid Other

## 2023-05-25 VITALS — BP 115/77 | HR 67 | Ht 67.0 in | Wt 209.0 lb

## 2023-05-25 DIAGNOSIS — R6882 Decreased libido: Secondary | ICD-10-CM

## 2023-05-25 MED ORDER — TESTOSTERONE CYPIONATE 100 MG/ML IM SOLN
50.0000 mg | Freq: Once | INTRAMUSCULAR | Status: AC
Start: 2023-05-25 — End: 2023-05-25
  Administered 2023-05-25: 50 mg via INTRAMUSCULAR

## 2023-05-25 NOTE — Telephone Encounter (Signed)
Ok.  That's where I got confused since the pictures of the testosterone medication were attached.  Can send in Flagyl for BV.

## 2023-05-25 NOTE — Telephone Encounter (Signed)
Pt came in today for nurse visit and she would like oral tablets sent to her pharmacy.

## 2023-05-25 NOTE — Progress Notes (Signed)
Patient presents for testosterone injection for decreased libido. Dr. Valentino Saxon okayed 0.5 mL since 0.47 mL hard to fill on syringe. Patient toleratored well.

## 2023-05-25 NOTE — Telephone Encounter (Signed)
I'm sorry.  Oral tablets for what?  If she is speaking of the testosterone, those are not FDA approved for treatment in women and will not be able to be prescribed.  The ones that are approved for female use are the injectable and the gel. The injectable is typically the cheapest of the two options.

## 2023-05-26 MED ORDER — METRONIDAZOLE 500 MG PO TABS: 500.0000 mg | ORAL_TABLET | Freq: Two times a day (BID) | ORAL | 0 refills | Status: DC

## 2023-06-21 NOTE — Progress Notes (Unsigned)
    GYNECOLOGY PROGRESS NOTE  Subjective:    Patient ID: Lauren Collier, female    DOB: [birth year], 57 y.o.   MRN: [id number]  HPI  Patient is a 57 y.o. [phone] female who presents for follow up for decreased libido. She started back testosterone injections for her decreased libido. She will continue therapy for 4 months.   {Common ambulatory SmartLinks:19316}  Review of Systems {ros; complete:30496}   Objective:   There were no vitals taken for this visit. There is no height or weight on file to calculate BMI. General appearance: {general exam:16600} Abdomen: {abdominal exam:16834} Pelvic: {pelvic exam:16852::"cervix normal in appearance","external genitalia normal","no adnexal masses or tenderness","no cervical motion tenderness","rectovaginal septum normal","uterus normal size, shape, and consistency","vagina normal without discharge"} Extremities: {extremity exam:5109} Neurologic: {neuro exam:17854}   Assessment:   No diagnosis found.   Plan:   There are no diagnoses linked to this encounter.     Hildred Laser, MD [facility]

## 2023-06-22 ENCOUNTER — Ambulatory Visit: Payer: Medicaid Other | Admitting: Obstetrics and Gynecology

## 2023-06-22 ENCOUNTER — Encounter: Payer: Self-pay | Admitting: Obstetrics and Gynecology

## 2023-06-22 VITALS — BP 112/72 | HR 73 | Resp 16 | Ht 67.0 in | Wt 214.1 lb

## 2023-06-22 DIAGNOSIS — R6882 Decreased libido: Secondary | ICD-10-CM

## 2023-06-24 ENCOUNTER — Ambulatory Visit: Payer: Medicaid Other

## 2023-06-24 NOTE — Progress Notes (Unsigned)
    NURSE VISIT NOTE  Subjective:    Patient ID: TUNJA IVINS, female    DOB: 1966-02-04, 57 y.o.   MRN: [id number]  HPI  Patient is a 57 y.o. [phone] female who presents for second Testosterone Injection.   Objective:    Indication for injection:  Decreased Libido LMP:  Postmenopausal Contraception:   Postmenopausal Serum HCG indicated? No . Testosterone Order:  estosterone cypionate (DEPOTESTOTERONE CYPIONATE) 100 MG/ML injection 0.47 Milliliter(s) IM Every 4 Weeks  Order to administer given by Hildred Laser, MD on 05/18/2023. Testosterone IM given by: Santiago Bumpers, CMA. Site: Left Upper Outer Quandrant  Lab Review  None  Assessment:   1. Decreased libido      Plan:   Testosterone Plan: Administer IM every 4 weeks.       Santiago Bumpers, CMA [facility]

## 2023-06-27 ENCOUNTER — Ambulatory Visit: Payer: Medicaid Other

## 2023-06-27 VITALS — BP 120/82 | HR 77 | Resp 16 | Ht 67.0 in | Wt 214.0 lb

## 2023-06-27 DIAGNOSIS — R6882 Decreased libido: Secondary | ICD-10-CM | POA: Diagnosis not present

## 2023-07-11 ENCOUNTER — Encounter: Payer: Self-pay | Admitting: Obstetrics and Gynecology

## 2023-07-11 NOTE — Telephone Encounter (Signed)
These are medications that should be filled by a PCP. If she does not have a PCP, we can refer her. Can then send short supply for 1-2 months until she is established.

## 2023-07-12 ENCOUNTER — Emergency Department
Admission: EM | Admit: 2023-07-12 | Discharge: 2023-07-12 | Discharge status: Against Medical Advice | Payer: Medicaid Other | Attending: Emergency Medicine | Admitting: Emergency Medicine

## 2023-07-12 ENCOUNTER — Encounter: Payer: Self-pay | Admitting: Emergency Medicine

## 2023-07-12 ENCOUNTER — Other Ambulatory Visit: Payer: Self-pay

## 2023-07-12 ENCOUNTER — Emergency Department: Payer: Medicaid Other

## 2023-07-12 DIAGNOSIS — Z5321 Procedure and treatment not carried out due to patient leaving prior to being seen by health care provider: Secondary | ICD-10-CM | POA: Insufficient documentation

## 2023-07-12 DIAGNOSIS — R079 Chest pain, unspecified: Secondary | ICD-10-CM | POA: Insufficient documentation

## 2023-07-12 DIAGNOSIS — Z1152 Encounter for screening for COVID-19: Secondary | ICD-10-CM | POA: Diagnosis not present

## 2023-07-12 DIAGNOSIS — R059 Cough, unspecified: Secondary | ICD-10-CM | POA: Diagnosis not present

## 2023-07-12 LAB — CBC
HCT: 37.2 % (ref 36.0–46.0)
Hemoglobin: 12.1 g/dL (ref 12.0–15.0)
MCH: 30.3 pg (ref 26.0–34.0)
MCHC: 32.5 g/dL (ref 30.0–36.0)
MCV: 93 fL (ref 80.0–100.0)
Platelets: 277 10*3/uL (ref 150–400)
RBC: 4 MIL/uL (ref 3.87–5.11)
RDW: 12.5 % (ref 11.5–15.5)
WBC: 11.5 10*3/uL — ABNORMAL HIGH (ref 4.0–10.5)
nRBC: 0 % (ref 0.0–0.2)

## 2023-07-12 LAB — RESP PANEL BY RT-PCR (RSV, FLU A&B, COVID)  RVPGX2
Influenza A by PCR: NEGATIVE
Influenza B by PCR: NEGATIVE
Resp Syncytial Virus by PCR: NEGATIVE
SARS Coronavirus 2 by RT PCR: NEGATIVE

## 2023-07-12 LAB — BASIC METABOLIC PANEL
Anion gap: 7 (ref 5–15)
BUN: 14 mg/dL (ref 6–20)
CO2: 27 mmol/L (ref 22–32)
Calcium: 9.2 mg/dL (ref 8.9–10.3)
Chloride: 104 mmol/L (ref 98–111)
Creatinine, Ser: 1.12 mg/dL — ABNORMAL HIGH (ref 0.44–1.00)
GFR, Estimated: 58 mL/min — ABNORMAL LOW (ref >60)
Glucose, Bld: 97 mg/dL (ref 70–99)
Potassium: 3.4 mmol/L — ABNORMAL LOW (ref 3.5–5.1)
Sodium: 138 mmol/L (ref 135–145)

## 2023-07-12 LAB — TROPONIN I (HIGH SENSITIVITY)
Troponin I (High Sensitivity): 5 ng/L (ref <18)
Troponin I (High Sensitivity): 4 ng/L (ref <18)

## 2023-07-12 NOTE — ED Notes (Signed)
Called for repeat vitals, pt not answering in lobby at this time.

## 2023-07-12 NOTE — ED Triage Notes (Signed)
Patient ambulatory to triage with steady gait, without difficulty or distress noted; pt reports upper CP for several days with prod cough yellow sputum; denies any other accomp symptoms

## 2023-07-19 ENCOUNTER — Other Ambulatory Visit: Payer: Self-pay

## 2023-07-19 MED ORDER — LOSARTAN POTASSIUM-HCTZ 50-12.5 MG PO TABS: 1.0000 | ORAL_TABLET | Freq: Every day | ORAL | 2 refills | Status: AC

## 2023-07-19 MED ORDER — LIOTHYRONINE SODIUM 5 MCG PO TABS: 5.0000 ug | ORAL_TABLET | Freq: Two times a day (BID) | ORAL | 2 refills | Status: AC

## 2023-07-19 MED ORDER — SPIRONOLACTONE 25 MG PO TABS: 25.0000 mg | ORAL_TABLET | Freq: Every day | ORAL | 2 refills | Status: AC

## 2023-07-22 NOTE — Patient Instructions (Signed)
Testosterone Injection What is this medication? TESTOSTERONE (tes TOS ter one) is used to increase testosterone levels in your body. It belongs to a group of medications called androgen hormones. This medicine may be used for other purposes; ask your health care provider or pharmacist if you have questions. COMMON BRAND NAME(S): Andro-L.A., Aveed, Delatestryl, Depo-Testosterone, Testone CIK, Virilon What should I tell my care team before I take this medication? They need to know if you have any of these conditions: Cancer Diabetes Heart disease Kidney disease Liver disease Lung disease Prostate disease An unusual or allergic reaction to testosterone, other medications, foods, dyes, or preservatives If you or your partner are pregnant or trying to get pregnant Breastfeeding How should I use this medication? This medication is injected into a muscle. It is given by your care team in a hospital or clinic setting. A special MedGuide will be given to you by the pharmacist with each prescription and refill. Be sure to read this information carefully each time. Contact your care team about the use of this medication in children. While it be prescribed for children as young as 12 years for selected conditions, precautions do apply. Overdosage: If you think you have taken too much of this medicine contact a poison control center or emergency room at once. NOTE: This medicine is only for you. Do not share this medicine with others. What if I miss a dose? Try not to miss a dose. Your care team will tell you when your next injection is due. Notify the office if you are unable to keep an appointment. What may interact with this medication? Medications for diabetes Medications that treat or prevent blood clots like warfarin Oxyphenbutazone Propranolol Steroid medications like prednisone or cortisone This list may not describe all possible interactions. Give your health care provider a list of all  the medicines, herbs, non-prescription drugs, or dietary supplements you use. Also tell them if you smoke, drink alcohol, or use illegal drugs. Some items may interact with your medicine. What should I watch for while using this medication? Visit your care team for regular checks on your progress. They will need to check the level of testosterone in your blood. Heart attacks and strokes have been reported with the use of this medication. Get emergency help if you develop signs or symptoms of a heart attack or stroke. Talk to your care team about the risks and benefits of this medication. This medication may affect blood sugar levels. If you have diabetes, check with your care team before you change your diet or the dose of your diabetic medication. Talk to your care team if you wish to become pregnant or think you might be pregnant. This medication can cause serious birth defects if taken during pregnancy. This medication is banned from use in athletes by most athletic organizations. What side effects may I notice from receiving this medication? Side effects that you should report to your care team as soon as possible: Allergic reactions--skin rash, itching, hives, swelling of the face, lips, tongue, or throat Blood clot--pain, swelling, or warmth in the leg, shortness of breath, chest pain Heart attack--pain or tightness in the chest, shoulders, arms or jaw, nausea, shortness of breath, cold or clammy skin, feeling faint or lightheaded Increase in blood pressure Liver injury--right upper belly pain, loss of appetite, nausea, light-colored stool, dark yellow or brown urine, yellowing of the skin or eyes, unusual weakness or fatigue Mood swings, irritability, or hostility Prolonged or painful erection Sleep apnea--loud snoring, gasping  during sleep, daytime sleepiness Stroke--sudden numbness or weakness of the face, arm or leg, trouble speaking, confusion, trouble walking, loss of balance or  coordination, dizziness, severe headache, change in vision Swelling of the ankles, hands, or feet Thoughts of suicide or self-harm, worsening mood, feelings of depression Side effects that usually do not require medical attention (report to your care team if they continue or are bothersome): Acne Change in sex drive or performance Pain, redness, or irritation at the application site Unexpected breast tissue growth This list may not describe all possible side effects. Call your doctor for medical advice about side effects. You may report side effects to FDA at 1-800-FDA-1088. Where should I keep my medication? Keep out of the reach of children. This medication can be abused. Keep your medication in a safe place to protect it from theft. Do not share this medication with anyone. Selling or giving away this medication is dangerous and against the law. Store at room temperature between 20 and 25 degrees C (68 and 77 degrees F). Do not freeze. Protect from light. Follow the directions for the product you are prescribed. Throw away any unused medication after the expiration date. NOTE: This sheet is a summary. It may not cover all possible information. If you have questions about this medicine, talk to your doctor, pharmacist, or health care provider.  2024 Elsevier/Gold Standard (2022-05-25 00:00:00)

## 2023-07-22 NOTE — Progress Notes (Unsigned)
    NURSE VISIT NOTE  Subjective:    Patient ID: Lauren Collier, female    DOB: 03/03/1966, 57 y.o.   MRN: [id number]  HPI  Patient is a 57 y.o. [phone] female who presents for testosterone injection per order from Hildred Laser, MD.   Objective:    LMP  (LMP Unknown)   Testosterone IM {RIGHT/LEFT:20294} deltoid using aseptic technique. Pt tolerated well.     Assessment:   1. Decreased libido      Plan:    Return to clinic in 4 weeks.   Cornelius Moras, CMA

## 2023-07-25 ENCOUNTER — Ambulatory Visit (INDEPENDENT_AMBULATORY_CARE_PROVIDER_SITE_OTHER): Payer: Medicaid Other

## 2023-07-25 VITALS — BP 122/77 | HR 68 | Ht 67.0 in | Wt 218.0 lb

## 2023-07-25 DIAGNOSIS — R6882 Decreased libido: Secondary | ICD-10-CM | POA: Diagnosis not present

## 2023-07-25 MED ORDER — TESTOSTERONE CYPIONATE 100 MG/ML IM SOLN
100.0000 mg | Freq: Once | INTRAMUSCULAR | Status: AC
  Administered 2023-07-25: 100 mg via INTRAMUSCULAR

## 2023-08-26 ENCOUNTER — Ambulatory Visit (INDEPENDENT_AMBULATORY_CARE_PROVIDER_SITE_OTHER): Payer: Medicaid Other

## 2023-08-26 VITALS — BP 120/80 | Ht 67.0 in | Wt 217.0 lb

## 2023-08-26 DIAGNOSIS — R6882 Decreased libido: Secondary | ICD-10-CM | POA: Diagnosis not present

## 2023-08-26 MED ORDER — TESTOSTERONE CYPIONATE 100 MG/ML IM SOLN
100.0000 mg | Freq: Once | INTRAMUSCULAR | Status: AC
  Administered 2023-08-26: 100 mg via INTRAMUSCULAR

## 2023-08-26 NOTE — Progress Notes (Signed)
    NURSE VISIT NOTE  Subjective:    Patient ID: Lauren Collier, female    DOB: 17-Mar-1966, 58 y.o.   MRN: [id number]  HPI  Patient is a 58 y.o. [phone] female who presents for Testosterone per order from Hildred Laser, MD.   Objective:    BP 120/80   Ht 5\' 7"  (1.702 m)   Wt 217 lb (98.4 kg)   LMP  (LMP Unknown)   BMI 33.99 kg/m   testosterone cypionate (DEPOTESTOTERONE CYPIONATE) 0.49ml injection  administered IM left  Upper Outer Quandrant using aseptic technique. Pt tolerated well.   Lab Review  @THIS  VISIT ONLY@  Assessment:   1. Decreased libido      Plan:    Return to clinic4 weeks.   Cornelius Moras, CMA

## 2023-08-26 NOTE — Patient Instructions (Signed)
Testosterone Injection What is this medication? TESTOSTERONE (tes TOS ter one) is used to increase testosterone levels in your body. It belongs to a group of medications called androgen hormones. This medicine may be used for other purposes; ask your health care provider or pharmacist if you have questions. COMMON BRAND NAME(S): Andro-L.A., Aveed, Azmiro, Delatestryl, Depo-Testosterone, Testone CIK, Virilon What should I tell my care team before I take this medication? They need to know if you have any of these conditions: Cancer Diabetes Heart disease Kidney disease Liver disease Lung disease Prostate disease An unusual or allergic reaction to testosterone, other medications, foods, dyes, or preservatives If you or your partner are pregnant or trying to get pregnant Breastfeeding How should I use this medication? This medication is injected into a muscle. It is given by your care team in a hospital or clinic setting. A special MedGuide will be given to you by the pharmacist with each prescription and refill. Be sure to read this information carefully each time. Contact your care team about the use of this medication in children. While it be prescribed for children as young as 12 years for selected conditions, precautions do apply. Overdosage: If you think you have taken too much of this medicine contact a poison control center or emergency room at once. NOTE: This medicine is only for you. Do not share this medicine with others. What if I miss a dose? Try not to miss a dose. Your care team will tell you when your next injection is due. Notify the office if you are unable to keep an appointment. What may interact with this medication? Medications for diabetes Medications that treat or prevent blood clots like warfarin Oxyphenbutazone Propranolol Steroid medications like prednisone or cortisone This list may not describe all possible interactions. Give your health care provider a list  of all the medicines, herbs, non-prescription drugs, or dietary supplements you use. Also tell them if you smoke, drink alcohol, or use illegal drugs. Some items may interact with your medicine. What should I watch for while using this medication? Visit your care team for regular checks on your progress. They will need to check the level of testosterone in your blood. Heart attacks and strokes have been reported with the use of this medication. Get emergency help if you develop signs or symptoms of a heart attack or stroke. Talk to your care team about the risks and benefits of this medication. This medication may affect blood sugar levels. If you have diabetes, check with your care team before you change your diet or the dose of your diabetic medication. Talk to your care team if you wish to become pregnant or think you might be pregnant. This medication can cause serious birth defects if taken during pregnancy. This medication is banned from use in athletes by most athletic organizations. What side effects may I notice from receiving this medication? Side effects that you should report to your care team as soon as possible: Allergic reactions--skin rash, itching, hives, swelling of the face, lips, tongue, or throat Blood clot--pain, swelling, or warmth in the leg, shortness of breath, chest pain Heart attack--pain or tightness in the chest, shoulders, arms or jaw, nausea, shortness of breath, cold or clammy skin, feeling faint or lightheaded Increase in blood pressure Liver injury--right upper belly pain, loss of appetite, nausea, light-colored stool, dark yellow or brown urine, yellowing of the skin or eyes, unusual weakness or fatigue Mood swings, irritability, or hostility Prolonged or painful erection Sleep apnea--loud snoring,  gasping during sleep, daytime sleepiness Stroke--sudden numbness or weakness of the face, arm or leg, trouble speaking, confusion, trouble walking, loss of balance or  coordination, dizziness, severe headache, change in vision Swelling of the ankles, hands, or feet Thoughts of suicide or self-harm, worsening mood, feelings of depression Side effects that usually do not require medical attention (report to your care team if they continue or are bothersome): Acne Change in sex drive or performance Pain, redness, or irritation at the application site Unexpected breast tissue growth This list may not describe all possible side effects. Call your doctor for medical advice about side effects. You may report side effects to FDA at 1-800-FDA-1088. Where should I keep my medication? Keep out of the reach of children. This medication can be abused. Keep your medication in a safe place to protect it from theft. Do not share this medication with anyone. Selling or giving away this medication is dangerous and against the law. Store at room temperature between 20 and 25 degrees C (68 and 77 degrees F). Do not freeze. Protect from light. Follow the directions for the product you are prescribed. Throw away any unused medication after the expiration date. NOTE: This sheet is a summary. It may not cover all possible information. If you have questions about this medicine, talk to your doctor, pharmacist, or health care provider.  2024 Elsevier/Gold Standard (2022-05-25 00:00:00)

## 2023-09-23 ENCOUNTER — Ambulatory Visit: Payer: Medicaid Other

## 2023-09-23 NOTE — Progress Notes (Deleted)
    NURSE VISIT NOTE  Subjective:    Patient ID: Lauren Collier, female    DOB: 09-15-1965, 58 y.o.   MRN: [id number]  HPI  Patient is a 58 y.o. [id number] female who presents for testosterone injection per Dr. Hildred Laser.     Objective:    LMP  (LMP Unknown)   Assessment:   No diagnosis found.   Plan:    Patient verbalized understanding of instructions.   Fonda Kinder, CMA

## 2023-10-26 ENCOUNTER — Encounter: Payer: Self-pay | Admitting: Obstetrics and Gynecology

## 2023-10-26 NOTE — Telephone Encounter (Signed)
 Addyi is  for symptom management in premenopausal women. If she is still having fairly regular cycles she can utilize, otherwise it won't be beneficial for her.

## 2024-05-30 ENCOUNTER — Ambulatory Visit: Admitting: Sleep Medicine

## 2024-05-30 ENCOUNTER — Encounter: Payer: Self-pay | Admitting: Sleep Medicine

## 2024-05-30 VITALS — BP 100/80 | HR 75 | Temp 97.6°F | Ht 67.0 in | Wt 197.2 lb

## 2024-05-30 DIAGNOSIS — G4733 Obstructive sleep apnea (adult) (pediatric): Secondary | ICD-10-CM | POA: Diagnosis not present

## 2024-05-30 DIAGNOSIS — I10 Essential (primary) hypertension: Secondary | ICD-10-CM | POA: Diagnosis not present

## 2024-05-30 NOTE — Progress Notes (Signed)
 Name:Lauren Collier MRN: [id number] DOB: 1966/05/13   CHIEF COMPLAINT:  EXCESSIVE DAYTIME SLEEPINESS   HISTORY OF PRESENT ILLNESS: Lauren Collier is a 58 y.o. w/ a h/o HTN, obesity and hypothyroidism who presents for c/o loud snoring and excessive daytime sleepiness which has been present for several years. Reports nocturnal awakenings due to unclear reasons and has difficulty falling back to sleep. Denies any significant weight changes. Denies morning headaches, RLS symptoms, dream enactment, cataplexy, hypnagogic or hypnapompic hallucinations. Reports a family history of sleep apnea. Reports drowsy driving. Drinks 1 cup of tea daily, occasional alcohol use, denies tobacco or illicit drug use.   Bedtime 11 pm Sleep onset 30 mins Rise time 7-9 am   EPWORTH SLEEP SCORE 16    05/30/2024   11:00 AM  Results of the Epworth flowsheet  Sitting and reading 2  Watching TV 2  Sitting, inactive in a public place (e.g. a theatre or a meeting) 3  As a passenger in a car for an hour without a break 3  Lying down to rest in the afternoon when circumstances permit 3  Sitting and talking to someone 2  Sitting quietly after a lunch without alcohol 1  In a car, while stopped for a few minutes in traffic 0  Total score 16    PAST MEDICAL HISTORY :   has a past medical history of GERD (gastroesophageal reflux disease) and Hypertension.  has a past surgical history that includes Cesarean section; Appendectomy; Colonoscopy with propofol  (N/A, 10/05/2017); Esophagogastroduodenoscopy (egd) with propofol  (N/A, 10/05/2017); and Combined abdominoplasty and liposuction (05/2019). Prior to Admission medications   Medication Sig Start Date End Date Taking? Authorizing Provider  buPROPion (WELLBUTRIN XL) 150 MG 24 hr tablet Take 150 mg by mouth daily. 04/30/24  Yes [provider]  Cholecalciferol (VITAMIN D3) 1.25 MG (50000 UT) CAPS Take 1 capsule by mouth once a week. 01/26/24  Yes [provider]  DOTTI  0.025 MG/24HR Place 1 patch onto the skin 2 (two) times a week. 05/15/24  Yes [provider]  hydrochlorothiazide (HYDRODIURIL) 12.5 MG tablet Take 12.5 mg by mouth daily. 05/16/24  Yes [provider]  liothyronine  (CYTOMEL ) 5 MCG tablet Take 1 tablet (5 mcg total) by mouth 2 (two) times daily. 07/19/23  Yes Connell Davies, MD  losartan -hydrochlorothiazide (HYZAAR) 50-12.5 MG tablet Take 1 tablet by mouth daily. 07/19/23  Yes Connell Davies, MD  progesterone  (PROMETRIUM ) 100 MG capsule Take 100-200 mg by mouth at bedtime. 03/21/24  Yes [provider]  spironolactone  (ALDACTONE ) 25 MG tablet Take 1 tablet (25 mg total) by mouth daily. 07/19/23  Yes Connell Davies, MD  testosterone  cypionate (DEPOTESTOTERONE CYPIONATE) 100 MG/ML injection SMARTSIG:0.47 Milliliter(s) IM Every 4 Weeks 05/23/23  Yes [provider]   Allergies  Allergen Reactions   Penicillins     Yeast infections    FAMILY HISTORY:  family history includes Breast cancer (age of onset: 27) in her mother; Colon cancer in her maternal grandmother; Diabetes in her mother; Heart disease in her sister; Ovarian cancer in her cousin; Ovarian cancer (age of onset: 23) in her cousin. SOCIAL HISTORY:  reports that she has never smoked. She has never used smokeless tobacco. She reports current alcohol use. She reports that she does not use drugs.   Review of Systems:  Gen:  Denies  fever, sweats, chills weight loss  HEENT: Denies blurred vision, double vision, ear pain, eye pain, hearing loss, nose bleeds, sore throat Cardiac:  No dizziness, chest pain or heaviness, chest tightness,edema, No JVD Resp:   No cough, -sputum production, -shortness of breath,-wheezing, -hemoptysis,  Gi: Denies swallowing difficulty, stomach pain, nausea or vomiting, diarrhea, constipation, bowel incontinence Gu:  Denies bladder incontinence, burning urine Ext:   Denies Joint pain, stiffness or  swelling Skin: Denies  skin rash, easy bruising or bleeding or hives Endoc:  Denies polyuria, polydipsia , polyphagia or weight change Psych:   Denies depression, insomnia or hallucinations  Other:  All other systems negative  VITAL SIGNS: BP 100/80   Pulse 75   Temp 97.6 F (36.4 C)   Ht 5' 7 (1.702 m)   Wt 197 lb 3.2 oz (89.4 kg)   LMP  (LMP Unknown)   SpO2 97%   BMI 30.89 kg/m    Physical Examination:   General Appearance: No distress  EYES PERRLA, EOM intact.   NECK Supple, No JVD Pulmonary: normal breath sounds, No wheezing.  CardiovascularNormal S1,S2.  No m/r/g.   Abdomen: Benign, Soft, non-tender. Skin:   warm, no rashes, no ecchymosis  Extremities: normal, no cyanosis, clubbing. Neuro:without focal findings,  speech normal  PSYCHIATRIC: Mood, affect within normal limits.   ASSESSMENT AND PLAN  OSA I suspect that OSA is likely present due to clinical presentation. Discussed the consequences of untreated sleep apnea. Advised not to drive drowsy for safety of patient and others. Will complete further evaluation with a home sleep study and follow up to review results.    HTN Stable, on current management. Following with PCP.    MEDICATION ADJUSTMENTS/LABS AND TESTS ORDERED: Recommend Sleep Study   Patient  satisfied with Plan of action and management. All questions answered  Follow up to review HST results and treatment plan.   I spent a total of 35 minutes reviewing chart data, face-to-face evaluation with the patient, counseling and coordination of care as detailed above.    Love Milbourne, M.D.  Sleep Medicine [facility]

## 2024-05-30 NOTE — Patient Instructions (Addendum)
 SABRA
